# Patient Record
Sex: Female | Born: 1940 | Race: White | Hispanic: No | Marital: Single | Smoking: Former smoker
Health system: Southern US, Community
[De-identification: ages and names within clinical notes are randomized; demographics above are authoritative.]

## PROBLEM LIST (undated history)

## (undated) DIAGNOSIS — A4901 Methicillin susceptible Staphylococcus aureus infection, unspecified site: Secondary | ICD-10-CM

## (undated) DIAGNOSIS — L0390 Cellulitis, unspecified: Secondary | ICD-10-CM

## (undated) DIAGNOSIS — N63 Unspecified lump in unspecified breast: Secondary | ICD-10-CM

## (undated) DIAGNOSIS — C50919 Malignant neoplasm of unspecified site of unspecified female breast: Secondary | ICD-10-CM

## (undated) DIAGNOSIS — E119 Type 2 diabetes mellitus without complications: Secondary | ICD-10-CM

## (undated) DIAGNOSIS — Z973 Presence of spectacles and contact lenses: Secondary | ICD-10-CM

## (undated) DIAGNOSIS — I10 Essential (primary) hypertension: Secondary | ICD-10-CM

## (undated) DIAGNOSIS — Z923 Personal history of irradiation: Secondary | ICD-10-CM

## (undated) HISTORY — PX: BREAST LUMPECTOMY: SHX2

## (undated) HISTORY — PX: COLONOSCOPY: SHX174

## (undated) HISTORY — DX: Methicillin susceptible Staphylococcus aureus infection, unspecified site: A49.01

## (undated) HISTORY — DX: Type 2 diabetes mellitus without complications: E11.9

## (undated) HISTORY — DX: Essential (primary) hypertension: I10

## (undated) HISTORY — PX: TUBAL LIGATION: SHX77

## (undated) HISTORY — PX: TONSILLECTOMY: SUR1361

## (undated) HISTORY — PX: MOLE REMOVAL: SHX2046

## (undated) HISTORY — DX: Cellulitis, unspecified: L03.90

## (undated) HISTORY — PX: CHOLECYSTECTOMY: SHX55

## (undated) HISTORY — PX: CATARACT EXTRACTION, BILATERAL: SHX1313

## (undated) HISTORY — DX: Unspecified lump in unspecified breast: N63.0

---

## 1999-10-03 ENCOUNTER — Ambulatory Visit (HOSPITAL_COMMUNITY): Admission: RE | Admit: 1999-10-03 | Discharge: 1999-10-03 | Payer: Self-pay | Admitting: Family Medicine

## 1999-10-03 ENCOUNTER — Encounter: Payer: Self-pay | Admitting: Family Medicine

## 2001-05-14 ENCOUNTER — Encounter: Admission: RE | Admit: 2001-05-14 | Discharge: 2001-05-14 | Payer: Self-pay | Admitting: Family Medicine

## 2001-05-14 ENCOUNTER — Encounter: Payer: Self-pay | Admitting: Family Medicine

## 2001-11-24 ENCOUNTER — Ambulatory Visit (HOSPITAL_COMMUNITY): Admission: RE | Admit: 2001-11-24 | Discharge: 2001-11-24 | Payer: Self-pay | Admitting: *Deleted

## 2001-11-24 ENCOUNTER — Encounter: Payer: Self-pay | Admitting: *Deleted

## 2006-10-18 ENCOUNTER — Other Ambulatory Visit: Admission: RE | Admit: 2006-10-18 | Discharge: 2006-10-18 | Payer: Self-pay | Admitting: *Deleted

## 2006-10-21 ENCOUNTER — Encounter: Admission: RE | Admit: 2006-10-21 | Discharge: 2006-10-21 | Payer: Self-pay | Admitting: *Deleted

## 2006-10-29 ENCOUNTER — Encounter: Admission: RE | Admit: 2006-10-29 | Discharge: 2006-10-29 | Payer: Self-pay | Admitting: *Deleted

## 2008-08-06 HISTORY — PX: BACK SURGERY: SHX140

## 2009-03-24 ENCOUNTER — Other Ambulatory Visit: Admission: RE | Admit: 2009-03-24 | Discharge: 2009-03-24 | Payer: Self-pay | Admitting: Family Medicine

## 2009-03-28 ENCOUNTER — Encounter: Admission: RE | Admit: 2009-03-28 | Discharge: 2009-03-28 | Payer: Self-pay | Admitting: Family Medicine

## 2009-04-25 ENCOUNTER — Encounter: Admission: RE | Admit: 2009-04-25 | Discharge: 2009-04-25 | Payer: Self-pay | Admitting: Neurological Surgery

## 2009-05-12 ENCOUNTER — Inpatient Hospital Stay (HOSPITAL_COMMUNITY): Admission: RE | Admit: 2009-05-12 | Discharge: 2009-05-15 | Payer: Self-pay | Admitting: Neurological Surgery

## 2009-06-20 ENCOUNTER — Encounter: Admission: RE | Admit: 2009-06-20 | Discharge: 2009-06-20 | Payer: Self-pay | Admitting: Neurological Surgery

## 2009-08-29 ENCOUNTER — Encounter: Admission: RE | Admit: 2009-08-29 | Discharge: 2009-08-29 | Payer: Self-pay | Admitting: Neurological Surgery

## 2009-10-10 ENCOUNTER — Encounter: Admission: RE | Admit: 2009-10-10 | Discharge: 2009-10-10 | Payer: Self-pay | Admitting: Neurological Surgery

## 2010-01-09 ENCOUNTER — Encounter: Admission: RE | Admit: 2010-01-09 | Discharge: 2010-01-09 | Payer: Self-pay | Admitting: Neurological Surgery

## 2010-10-16 ENCOUNTER — Other Ambulatory Visit: Payer: Self-pay | Admitting: Family Medicine

## 2010-10-16 DIAGNOSIS — Z1231 Encounter for screening mammogram for malignant neoplasm of breast: Secondary | ICD-10-CM

## 2010-10-24 ENCOUNTER — Ambulatory Visit
Admission: RE | Admit: 2010-10-24 | Discharge: 2010-10-24 | Disposition: A | Discharge status: Home / Self Care | Payer: Medicare Other | Source: Ambulatory Visit | Attending: Family Medicine | Admitting: Family Medicine

## 2010-10-24 DIAGNOSIS — Z1231 Encounter for screening mammogram for malignant neoplasm of breast: Secondary | ICD-10-CM

## 2010-10-26 ENCOUNTER — Other Ambulatory Visit: Payer: Self-pay | Admitting: Family Medicine

## 2010-10-26 DIAGNOSIS — R928 Other abnormal and inconclusive findings on diagnostic imaging of breast: Secondary | ICD-10-CM

## 2010-11-02 ENCOUNTER — Other Ambulatory Visit: Payer: Self-pay | Admitting: Family Medicine

## 2010-11-02 ENCOUNTER — Ambulatory Visit
Admission: RE | Admit: 2010-11-02 | Discharge: 2010-11-02 | Disposition: A | Discharge status: Home / Self Care | Payer: 59 | Source: Ambulatory Visit | Attending: Family Medicine | Admitting: Family Medicine

## 2010-11-02 DIAGNOSIS — R928 Other abnormal and inconclusive findings on diagnostic imaging of breast: Secondary | ICD-10-CM

## 2010-11-02 DIAGNOSIS — N6320 Unspecified lump in the left breast, unspecified quadrant: Secondary | ICD-10-CM

## 2010-11-03 ENCOUNTER — Ambulatory Visit
Admission: RE | Admit: 2010-11-03 | Discharge: 2010-11-03 | Disposition: A | Discharge status: Home / Self Care | Payer: 59 | Source: Ambulatory Visit | Attending: Family Medicine | Admitting: Family Medicine

## 2010-11-03 ENCOUNTER — Other Ambulatory Visit: Payer: Self-pay | Admitting: Diagnostic Radiology

## 2010-11-03 ENCOUNTER — Other Ambulatory Visit: Payer: Self-pay | Admitting: Family Medicine

## 2010-11-03 DIAGNOSIS — R928 Other abnormal and inconclusive findings on diagnostic imaging of breast: Secondary | ICD-10-CM

## 2010-11-03 DIAGNOSIS — N6320 Unspecified lump in the left breast, unspecified quadrant: Secondary | ICD-10-CM

## 2010-11-07 ENCOUNTER — Other Ambulatory Visit: Payer: Self-pay | Admitting: Family Medicine

## 2010-11-07 DIAGNOSIS — C50912 Malignant neoplasm of unspecified site of left female breast: Secondary | ICD-10-CM

## 2010-11-09 LAB — BASIC METABOLIC PANEL
BUN: 29 mg/dL — ABNORMAL HIGH (ref 6–23)
Calcium: 10.1 mg/dL (ref 8.4–10.5)
Chloride: 108 mEq/L (ref 96–112)
Creatinine, Ser: 0.93 mg/dL (ref 0.4–1.2)
Glucose, Bld: 110 mg/dL — ABNORMAL HIGH (ref 70–99)
Potassium: 4.4 mEq/L (ref 3.5–5.1)
Sodium: 139 mEq/L (ref 135–145)

## 2010-11-09 LAB — GLUCOSE, CAPILLARY
Glucose-Capillary: 106 mg/dL — ABNORMAL HIGH (ref 70–99)
Glucose-Capillary: 119 mg/dL — ABNORMAL HIGH (ref 70–99)
Glucose-Capillary: 134 mg/dL — ABNORMAL HIGH (ref 70–99)

## 2010-11-09 LAB — CBC
HCT: 25.8 % — ABNORMAL LOW (ref 36.0–46.0)
HCT: 33.9 % — ABNORMAL LOW (ref 36.0–46.0)
Hemoglobin: 11.6 g/dL — ABNORMAL LOW (ref 12.0–15.0)
Hemoglobin: 8.5 g/dL — ABNORMAL LOW (ref 12.0–15.0)
MCHC: 33.4 g/dL (ref 30.0–36.0)
MCV: 93.8 fL (ref 78.0–100.0)
Platelets: 142 10*3/uL — ABNORMAL LOW (ref 150–400)
RBC: 2.7 MIL/uL — ABNORMAL LOW (ref 3.87–5.11)
RBC: 2.71 MIL/uL — ABNORMAL LOW (ref 3.87–5.11)
RDW: 14.2 % (ref 11.5–15.5)
WBC: 4.7 10*3/uL (ref 4.0–10.5)
WBC: 4.9 10*3/uL (ref 4.0–10.5)

## 2010-11-09 LAB — TYPE AND SCREEN: Antibody Screen: NEGATIVE

## 2010-11-09 LAB — DIFFERENTIAL
Basophils Absolute: 0.1 10*3/uL (ref 0.0–0.1)
Monocytes Relative: 9 % (ref 3–12)
Neutro Abs: 2.4 10*3/uL (ref 1.7–7.7)
Neutrophils Relative %: 51 % (ref 43–77)

## 2010-11-10 ENCOUNTER — Ambulatory Visit
Admission: RE | Admit: 2010-11-10 | Discharge: 2010-11-10 | Disposition: A | Discharge status: Home / Self Care | Payer: 59 | Source: Ambulatory Visit | Attending: Family Medicine | Admitting: Family Medicine

## 2010-11-10 DIAGNOSIS — C50912 Malignant neoplasm of unspecified site of left female breast: Secondary | ICD-10-CM

## 2010-11-10 MED ORDER — GADOBENATE DIMEGLUMINE 529 MG/ML IV SOLN
15.0000 mL | Freq: Once | INTRAVENOUS | Status: AC | PRN
  Administered 2010-11-10: 15 mL via INTRAVENOUS

## 2010-11-15 ENCOUNTER — Encounter (HOSPITAL_BASED_OUTPATIENT_CLINIC_OR_DEPARTMENT_OTHER): Payer: 59 | Admitting: Oncology

## 2010-11-15 ENCOUNTER — Other Ambulatory Visit: Payer: Self-pay | Admitting: Oncology

## 2010-11-15 DIAGNOSIS — Z09 Encounter for follow-up examination after completed treatment for conditions other than malignant neoplasm: Secondary | ICD-10-CM

## 2010-11-15 DIAGNOSIS — C50419 Malignant neoplasm of upper-outer quadrant of unspecified female breast: Secondary | ICD-10-CM

## 2010-11-15 DIAGNOSIS — C50919 Malignant neoplasm of unspecified site of unspecified female breast: Secondary | ICD-10-CM

## 2010-11-15 LAB — CBC WITH DIFFERENTIAL/PLATELET
BASO%: 0.7 % (ref 0.0–2.0)
Eosinophils Absolute: 0.2 10*3/uL (ref 0.0–0.5)
HCT: 33 % — ABNORMAL LOW (ref 34.8–46.6)
MCHC: 34.5 g/dL (ref 31.5–36.0)
MONO#: 0.5 10*3/uL (ref 0.1–0.9)
NEUT#: 2.2 10*3/uL (ref 1.5–6.5)
RBC: 3.6 10*6/uL — ABNORMAL LOW (ref 3.70–5.45)
WBC: 4.6 10*3/uL (ref 3.9–10.3)
lymph#: 1.7 10*3/uL (ref 0.9–3.3)

## 2010-11-15 LAB — COMPREHENSIVE METABOLIC PANEL
ALT: 18 U/L (ref 0–35)
Albumin: 4.1 g/dL (ref 3.5–5.2)
CO2: 25 mEq/L (ref 19–32)
Calcium: 9.3 mg/dL (ref 8.4–10.5)
Chloride: 106 mEq/L (ref 96–112)
Sodium: 138 mEq/L (ref 135–145)
Total Protein: 6.9 g/dL (ref 6.0–8.3)

## 2010-11-15 LAB — CANCER ANTIGEN 27.29: CA 27.29: 19 U/mL (ref 0–39)

## 2010-11-17 ENCOUNTER — Other Ambulatory Visit: Payer: Self-pay | Admitting: General Surgery

## 2010-11-17 ENCOUNTER — Other Ambulatory Visit (HOSPITAL_COMMUNITY): Payer: Self-pay | Admitting: General Surgery

## 2010-11-17 DIAGNOSIS — C50912 Malignant neoplasm of unspecified site of left female breast: Secondary | ICD-10-CM

## 2010-11-20 ENCOUNTER — Ambulatory Visit
Admission: RE | Admit: 2010-11-20 | Discharge: 2010-11-20 | Disposition: A | Discharge status: Home / Self Care | Payer: 59 | Source: Ambulatory Visit | Attending: Oncology | Admitting: Oncology

## 2010-11-20 DIAGNOSIS — Z09 Encounter for follow-up examination after completed treatment for conditions other than malignant neoplasm: Secondary | ICD-10-CM

## 2010-11-24 ENCOUNTER — Encounter (HOSPITAL_COMMUNITY): Payer: Self-pay

## 2010-11-24 ENCOUNTER — Ambulatory Visit (HOSPITAL_COMMUNITY)
Admission: RE | Admit: 2010-11-24 | Discharge: 2010-11-24 | Disposition: A | Discharge status: Home / Self Care | Payer: 59 | Source: Ambulatory Visit | Attending: General Surgery | Admitting: General Surgery

## 2010-11-24 ENCOUNTER — Other Ambulatory Visit (HOSPITAL_COMMUNITY): Payer: Self-pay | Admitting: General Surgery

## 2010-11-24 ENCOUNTER — Encounter (HOSPITAL_COMMUNITY)
Admission: RE | Admit: 2010-11-24 | Discharge: 2010-11-24 | Disposition: A | Discharge status: Home / Self Care | Payer: 59 | Source: Ambulatory Visit | Attending: Oncology | Admitting: Oncology

## 2010-11-24 ENCOUNTER — Encounter (HOSPITAL_COMMUNITY)
Admission: RE | Admit: 2010-11-24 | Discharge: 2010-11-24 | Disposition: A | Discharge status: Home / Self Care | Payer: 59 | Source: Ambulatory Visit | Attending: General Surgery | Admitting: General Surgery

## 2010-11-24 DIAGNOSIS — J984 Other disorders of lung: Secondary | ICD-10-CM | POA: Insufficient documentation

## 2010-11-24 DIAGNOSIS — Z981 Arthrodesis status: Secondary | ICD-10-CM | POA: Insufficient documentation

## 2010-11-24 DIAGNOSIS — C50919 Malignant neoplasm of unspecified site of unspecified female breast: Secondary | ICD-10-CM | POA: Insufficient documentation

## 2010-11-24 DIAGNOSIS — Z01811 Encounter for preprocedural respiratory examination: Secondary | ICD-10-CM

## 2010-11-24 DIAGNOSIS — Z01810 Encounter for preprocedural cardiovascular examination: Secondary | ICD-10-CM | POA: Insufficient documentation

## 2010-11-24 DIAGNOSIS — Z01812 Encounter for preprocedural laboratory examination: Secondary | ICD-10-CM | POA: Insufficient documentation

## 2010-11-24 DIAGNOSIS — Z01818 Encounter for other preprocedural examination: Secondary | ICD-10-CM | POA: Insufficient documentation

## 2010-11-24 HISTORY — DX: Malignant neoplasm of unspecified site of unspecified female breast: C50.919

## 2010-11-24 HISTORY — DX: Essential (primary) hypertension: I10

## 2010-11-24 LAB — CBC
MCHC: 33.9 g/dL (ref 30.0–36.0)
Platelets: 223 10*3/uL (ref 150–400)
RDW: 13.9 % (ref 11.5–15.5)

## 2010-11-24 LAB — BASIC METABOLIC PANEL
Calcium: 9.6 mg/dL (ref 8.4–10.5)
GFR calc Af Amer: >60 mL/min (ref >60)
GFR calc non Af Amer: >60 mL/min (ref >60)
Glucose, Bld: 91 mg/dL (ref 70–99)
Sodium: 138 mEq/L (ref 135–145)

## 2010-11-24 LAB — SURGICAL PCR SCREEN: MRSA, PCR: NEGATIVE

## 2010-11-24 MED ORDER — TECHNETIUM TC 99M MEDRONATE IV KIT
24.0000 | PACK | Freq: Once | INTRAVENOUS | Status: AC | PRN
  Administered 2010-11-24: 24 via INTRAVENOUS

## 2010-11-24 MED ORDER — IOHEXOL 300 MG/ML  SOLN
80.0000 mL | Freq: Once | INTRAMUSCULAR | Status: AC | PRN
  Administered 2010-11-24: 80 mL via INTRAVENOUS

## 2010-11-28 ENCOUNTER — Ambulatory Visit (HOSPITAL_COMMUNITY)
Admission: RE | Admit: 2010-11-28 | Discharge: 2010-11-28 | Disposition: A | Discharge status: Home / Self Care | Payer: 59 | Source: Ambulatory Visit | Attending: General Surgery | Admitting: General Surgery

## 2010-11-28 ENCOUNTER — Other Ambulatory Visit: Payer: Self-pay | Admitting: General Surgery

## 2010-11-28 ENCOUNTER — Ambulatory Visit
Admission: RE | Admit: 2010-11-28 | Discharge: 2010-11-28 | Disposition: A | Discharge status: Home / Self Care | Payer: 59 | Source: Ambulatory Visit | Attending: General Surgery | Admitting: General Surgery

## 2010-11-28 DIAGNOSIS — C50912 Malignant neoplasm of unspecified site of left female breast: Secondary | ICD-10-CM

## 2010-11-28 DIAGNOSIS — C50919 Malignant neoplasm of unspecified site of unspecified female breast: Secondary | ICD-10-CM | POA: Insufficient documentation

## 2010-11-28 HISTORY — PX: BREAST SURGERY: SHX581

## 2010-11-28 LAB — GLUCOSE, CAPILLARY
Glucose-Capillary: 136 mg/dL — ABNORMAL HIGH (ref 70–99)
Glucose-Capillary: 121 mg/dL — ABNORMAL HIGH (ref 70–99)

## 2010-11-28 MED ORDER — TECHNETIUM TC 99M SULFUR COLLOID FILTERED
1.0000 | Freq: Once | INTRAVENOUS | Status: AC | PRN
  Administered 2010-11-28: 1 via INTRADERMAL

## 2010-12-03 NOTE — Op Note (Signed)
Renee [name], Renee [name]              ACCOUNT NO.:  [id number]  MEDICAL RECORD NO.:  [id number]           PATIENT TYPE:  O  LOCATION:  [facility]                         FACILITY:  [facility]  PHYSICIAN:  Ollen Gross. Vernell Morgans, M.D. DATE OF BIRTH:  Mar 27, 1941  DATE OF PROCEDURE:  11/28/2010 DATE OF DISCHARGE:  11/28/2010                              OPERATIVE REPORT   PREOPERATIVE DIAGNOSIS:  Left breast cancer.  POSTOPERATIVE DIAGNOSIS:  Left breast cancer.  PROCEDURES:  Left breast needle-localized lumpectomy and sentinel node biopsy x2 with injection of blue dye.  SURGEON:  Ollen Gross. Vernell Morgans, MD  ANESTHESIA:  General via LMA.  PROCEDURE IN DETAILS:  After informed consent was obtained, the patient was brought to the operating room, placed in supine position on the operating room table.  After an induction of general anesthesia, the patient's left chest, breast and axillary area were prepped with ChloraPrep, allowed to dry and then draped in usual sterile manner. Earlier in the day the patient had undergone an injection of 1 mCi of technetium sulfur colloid in the subareolar position, also earlier in the day the patient underwent a wire localization procedure and the wire was entering the upper outer portion of the left breast and headed medially.  At this point a curvilinear incision was made overlying the path of the wire.  The tumor was so high up in the upper outer quadrant that we actually were able to do the sentinel node biopsy through the same incision.  The hot spot in the left axilla was mapped out and a mark was placed on the skin overlying the hot spot.  The incision was made with a 15 blade knife and carried down through the skin and subcutaneous tissue sharply with the electrocautery.  Once into the breast tissue, a circular portion of breast tissue was excised sharply around the path of the wire.  This was all done with the electrocautery. At the same time the NeoProbe was used  to identify the hot spot up in the left axilla using a combination of some sharp dissection with the electrocautery and blunt tonsil dissection, we were able to identify initially a hot blue lymph node.  It was excised by some sharp dissection with the cautery and then the lymphatics were clamped with hemostats, divided and ligated with 3-0 Vicryl ties.  Ex vivo counts on sentinel node #1 were around 1300.  A second hot spot was identified. This was also excised sharply with the electrocautery.  Ex vivo counts on this were around 300.  There was no blue dye in this area.  This was sent as a sentinel node #2.  No other hot blue or palpable lymph nodes were identified in the left axilla.  The lumpectomy was then completed. The specimen radiograph was obtained that showed the clip and wire to be in the specimen.  It looked as though we were a little bit close on the inferior portion of the specimen and that is certainly where we seemed to be coming across some more nodular material.  An extra bit of the inferior portion of the cavity was  excised sharply with the electrocautery and stitches were placed on the new true surgical margin. The short stitch being medial and the long stitch being lateral.  The specimen itself was painted with the assigned paint colors.  I did put the yellow color medial and the orange color lateral which is transposed from the assigned paint colors.  Once this was accomplished, hemostasis was achieved using the Bovie electrocautery.  Wound was irrigated with copious amounts of saline.  The wound was infiltrated with 0.25% Marcaine.  The deep layer of the wound was then closed with interrupted 3-0 Vicryl stitches and the skin was closed with a running 4-0 Monocryl subcuticular stitch. Dermabond dressings were applied.  The patient tolerated the procedure well.  At the end of case, all needle, sponge and instrument counts were correct.  The patient was then awakened  and taken to recovery room in stable condition.     Ollen Gross. Vernell Morgans, M.D.     PST/MEDQ  D:  11/28/2010  T:  11/29/2010  Job:  [id number]  Electronically Signed by Chevis Pretty III M.D. on 12/03/2010 06:41:10 AM

## 2010-12-08 ENCOUNTER — Encounter (HOSPITAL_BASED_OUTPATIENT_CLINIC_OR_DEPARTMENT_OTHER)
Admission: RE | Admit: 2010-12-08 | Discharge: 2010-12-08 | Disposition: A | Discharge status: Home / Self Care | Payer: 59 | Source: Ambulatory Visit | Attending: General Surgery | Admitting: General Surgery

## 2010-12-08 LAB — BASIC METABOLIC PANEL
Calcium: 9.9 mg/dL (ref 8.4–10.5)
GFR calc Af Amer: >60 mL/min (ref >60)
GFR calc non Af Amer: 56 mL/min — ABNORMAL LOW (ref >60)
Potassium: 4.8 mEq/L (ref 3.5–5.1)
Sodium: 137 mEq/L (ref 135–145)

## 2010-12-11 ENCOUNTER — Other Ambulatory Visit: Payer: Self-pay | Admitting: General Surgery

## 2010-12-11 ENCOUNTER — Ambulatory Visit (HOSPITAL_BASED_OUTPATIENT_CLINIC_OR_DEPARTMENT_OTHER)
Admission: RE | Admit: 2010-12-11 | Discharge: 2010-12-11 | Disposition: A | Discharge status: Home / Self Care | Payer: 59 | Source: Ambulatory Visit | Attending: General Surgery | Admitting: General Surgery

## 2010-12-11 DIAGNOSIS — C50919 Malignant neoplasm of unspecified site of unspecified female breast: Secondary | ICD-10-CM | POA: Insufficient documentation

## 2010-12-11 DIAGNOSIS — Z79899 Other long term (current) drug therapy: Secondary | ICD-10-CM | POA: Insufficient documentation

## 2010-12-11 DIAGNOSIS — Z01812 Encounter for preprocedural laboratory examination: Secondary | ICD-10-CM | POA: Insufficient documentation

## 2010-12-11 LAB — GLUCOSE, CAPILLARY: Glucose-Capillary: 128 mg/dL — ABNORMAL HIGH (ref 70–99)

## 2010-12-14 ENCOUNTER — Encounter (HOSPITAL_BASED_OUTPATIENT_CLINIC_OR_DEPARTMENT_OTHER): Payer: 59 | Admitting: Oncology

## 2010-12-14 DIAGNOSIS — C50419 Malignant neoplasm of upper-outer quadrant of unspecified female breast: Secondary | ICD-10-CM

## 2010-12-19 NOTE — Op Note (Signed)
  Renee [name], SCHMUTZ              ACCOUNT NO.:  [id number]  MEDICAL RECORD NO.:  [id number]           PATIENT TYPE:  LOCATION:                                 FACILITY:  PHYSICIAN:  Ollen Gross. Vernell Morgans, M.D. DATE OF BIRTH:  03/31/1941  DATE OF PROCEDURE:  12/11/2010 DATE OF DISCHARGE:                              OPERATIVE REPORT   PREOPERATIVE DIAGNOSIS:  Left breast cancer with positive deep margin.  POSTOPERATIVE DIAGNOSIS:  Left breast cancer with positive deep margin.  PROCEDURE:  Re-excision of left breast deep margin.  SURGEON:  Ollen Gross. Vernell Morgans, MD  ANESTHESIA:  General via LMA.  PROCEDURE:  After informed consent was obtained, the patient was brought to the operating room and placed in supine position on the operating room table.  After adequate induction of general anesthesia, the patient's left breast was prepped with ChloraPrep and allowed to dry and draped in usual sterile manner.  The old incision was opened sharply with a 15 blade knife.  The seroma was evacuated.  The cavity was inspected.  The deep portion of the cavity was excised sharply with electrocautery all the way down to the chest wall muscle.  This specimen once it was removed was oriented with a short stitch superior and a long stitch on the new true deep surgical margin that was sent to pathology for further evaluation.  Hemostasis was achieved using Bovie electrocautery.  The wound was irrigated with copious amounts of saline and infiltrated with 0.25% Marcaine.  The deep layer of the wound was then closed with interrupted 3-0 Vicryl stitches and the skin was closed with a running 4-0 Monocryl subcuticular stitch.  Dermabond dressing was applied.  The patient tolerated the procedure well.  At the end of the case, all needle, sponge and instrument counts were correct.  The patient was then awakened and taken to recovery room in stable condition.     Ollen Gross. Vernell Morgans, M.D.     PST/MEDQ  D:   12/11/2010  T:  12/11/2010  Job:  [id number]  Electronically Signed by Chevis Pretty III M.D. on 12/19/2010 09:35:21 AM

## 2010-12-20 ENCOUNTER — Ambulatory Visit
Admission: RE | Admit: 2010-12-20 | Discharge: 2010-12-20 | Disposition: A | Discharge status: Home / Self Care | Payer: 59 | Source: Ambulatory Visit | Attending: Radiation Oncology | Admitting: Radiation Oncology

## 2010-12-20 DIAGNOSIS — Z9089 Acquired absence of other organs: Secondary | ICD-10-CM | POA: Insufficient documentation

## 2010-12-20 DIAGNOSIS — I10 Essential (primary) hypertension: Secondary | ICD-10-CM | POA: Insufficient documentation

## 2010-12-20 DIAGNOSIS — Z79899 Other long term (current) drug therapy: Secondary | ICD-10-CM | POA: Insufficient documentation

## 2010-12-20 DIAGNOSIS — Z170 Estrogen receptor positive status [ER+]: Secondary | ICD-10-CM | POA: Insufficient documentation

## 2010-12-20 DIAGNOSIS — C50419 Malignant neoplasm of upper-outer quadrant of unspecified female breast: Secondary | ICD-10-CM | POA: Insufficient documentation

## 2010-12-20 DIAGNOSIS — E785 Hyperlipidemia, unspecified: Secondary | ICD-10-CM | POA: Insufficient documentation

## 2010-12-20 DIAGNOSIS — E119 Type 2 diabetes mellitus without complications: Secondary | ICD-10-CM | POA: Insufficient documentation

## 2010-12-20 DIAGNOSIS — M129 Arthropathy, unspecified: Secondary | ICD-10-CM | POA: Insufficient documentation

## 2011-02-19 ENCOUNTER — Ambulatory Visit (INDEPENDENT_AMBULATORY_CARE_PROVIDER_SITE_OTHER): Payer: Medicare Other | Admitting: General Surgery

## 2011-02-19 ENCOUNTER — Encounter (INDEPENDENT_AMBULATORY_CARE_PROVIDER_SITE_OTHER): Payer: Self-pay | Admitting: General Surgery

## 2011-02-19 DIAGNOSIS — C50212 Malignant neoplasm of upper-inner quadrant of left female breast: Secondary | ICD-10-CM | POA: Insufficient documentation

## 2011-02-19 DIAGNOSIS — C50919 Malignant neoplasm of unspecified site of unspecified female breast: Secondary | ICD-10-CM

## 2011-02-19 NOTE — Progress Notes (Signed)
Subjective:     Patient ID: Renee [name], female   DOB: 15-Apr-1941, 70 y.o.   MRN: [id number]  HPI The patient is a 70 year old white female who is now about 3 months out from a left breast lumpectomy and negative sentinel node biopsy for a lobular T2 N0 left breast cancer. She was ER positive PR negative. She underwent appetite testing and her recurrence score was low. She finished her radiation therapy this morning. Except for some radiation burn to her skin she has no complaints.  Review of Systems     Objective:   Physical Exam On exam her left breast incision has healed up nicely. She has some significant radiation burn to the skin. Her breast is otherwise soft. She has a little thickness disease scar from the radiation change.    Assessment:     3 months status post lumpectomy for a lobular left breast cancer    Plan:     She will continue use the cream to help her skin heal. She has a prescription to start her antiestrogen therapy. We will plan to see her back in 3 months.

## 2011-03-02 ENCOUNTER — Ambulatory Visit (INDEPENDENT_AMBULATORY_CARE_PROVIDER_SITE_OTHER): Payer: Medicare Other | Admitting: General Surgery

## 2011-03-02 ENCOUNTER — Encounter (INDEPENDENT_AMBULATORY_CARE_PROVIDER_SITE_OTHER): Payer: Self-pay | Admitting: General Surgery

## 2011-03-02 ENCOUNTER — Other Ambulatory Visit (INDEPENDENT_AMBULATORY_CARE_PROVIDER_SITE_OTHER): Payer: Self-pay | Admitting: General Surgery

## 2011-03-02 DIAGNOSIS — IMO0002 Reserved for concepts with insufficient information to code with codable children: Secondary | ICD-10-CM

## 2011-03-02 MED ORDER — DOXYCYCLINE HYCLATE 50 MG PO CAPS: 100.0000 mg | ORAL_CAPSULE | Freq: Two times a day (BID) | ORAL | Status: DC

## 2011-03-02 NOTE — Progress Notes (Signed)
HPI This patient of Dr. Royston Sinner comes in with recurrent tenderness in her left breast. She is status post lumpectomy and had a previous aspiration of this fluid area by Dr. Jamey Ripa in May 2012.  PE On examination today she has a warm written breast not severely indurated very tender. There appeared to be in the left upper quadrant some fluid in the subcutaneous tissue.  Studiy review There are no studies to review.  Assessment Incisional seroma at previous lumpectomy site and patient who's had chemoradiation therapy.  Plan Under local anesthesia with Betadine prep of the aspirate 30 cc of cloudy serous fluid from her left breast. This brought some immediate relief to the patient who was somewhat apprehensive.  I will place her on doxycycline for the next 2 weeks and have her reassess the Dr. Carolynne Edouard in 2 weeks.

## 2011-03-08 ENCOUNTER — Other Ambulatory Visit (INDEPENDENT_AMBULATORY_CARE_PROVIDER_SITE_OTHER): Payer: Self-pay | Admitting: General Surgery

## 2011-03-08 ENCOUNTER — Encounter (INDEPENDENT_AMBULATORY_CARE_PROVIDER_SITE_OTHER): Payer: Self-pay | Admitting: General Surgery

## 2011-03-08 ENCOUNTER — Ambulatory Visit (INDEPENDENT_AMBULATORY_CARE_PROVIDER_SITE_OTHER): Payer: Medicare Other | Admitting: General Surgery

## 2011-03-08 VITALS — BP 156/92 | HR 80 | Temp 97.2°F

## 2011-03-08 DIAGNOSIS — IMO0002 Reserved for concepts with insufficient information to code with codable children: Secondary | ICD-10-CM

## 2011-03-08 MED ORDER — DOXYCYCLINE HYCLATE 100 MG PO TABS: 100.0000 mg | ORAL_TABLET | Freq: Two times a day (BID) | ORAL | Status: DC

## 2011-03-08 NOTE — Patient Instructions (Signed)
Continue doxycycline F/U in 1 week

## 2011-03-09 LAB — WOUND CULTURE

## 2011-03-09 NOTE — Progress Notes (Signed)
Subjective:     Patient ID: Retia Passe, female   DOB: 29-Nov-1940, 70 y.o.   MRN: [id number]  HPI The patient is a 70 year old white female who is now about 3/2 months out from a left lumpectomy and sentinel node mapping for a T2 N0 left breast cancer. She finished radiation the couple weeks ago. She had a low oncotype score and is not going to receive any chemotherapy. Over the last few days she's developed redness and pain in the left breast. Review of Systems     Objective:   Physical Exam On exam the left breast is red and hot. She is very tender over the lumpectomy site. We prepped her skin with alcohol and Betadine infiltrated with 1% lidocaine and drained about 15-20 cc of cloudy fluid out. This was sent for culture and sensitivity.    Assessment:     Cellulitis and possible abscess of the lumpectomy cavity.    Plan:     At this point I would favor using the doxycycline over the Augmentin in case it is a resistant bug. We will wait for the culture results to come back. I'll plan to see her next week to check her progress. She agrees to call me if she has any worsening of her symptoms in the meantime.

## 2011-03-12 LAB — CULTURE, ROUTINE-ABSCESS
Gram Stain: NONE SEEN
Gram Stain: NONE SEEN

## 2011-03-15 ENCOUNTER — Other Ambulatory Visit (INDEPENDENT_AMBULATORY_CARE_PROVIDER_SITE_OTHER): Payer: Self-pay

## 2011-03-15 DIAGNOSIS — L0390 Cellulitis, unspecified: Secondary | ICD-10-CM

## 2011-03-19 ENCOUNTER — Ambulatory Visit (INDEPENDENT_AMBULATORY_CARE_PROVIDER_SITE_OTHER): Payer: 59 | Admitting: General Surgery

## 2011-03-19 ENCOUNTER — Encounter (INDEPENDENT_AMBULATORY_CARE_PROVIDER_SITE_OTHER): Payer: Self-pay | Admitting: General Surgery

## 2011-03-19 DIAGNOSIS — C50919 Malignant neoplasm of unspecified site of unspecified female breast: Secondary | ICD-10-CM

## 2011-03-19 NOTE — Patient Instructions (Signed)
Plan for ultrasound guided aspiration of left breast F/U in next 1-2 weeks

## 2011-03-20 ENCOUNTER — Other Ambulatory Visit (INDEPENDENT_AMBULATORY_CARE_PROVIDER_SITE_OTHER): Payer: Self-pay | Admitting: General Surgery

## 2011-03-20 ENCOUNTER — Encounter (INDEPENDENT_AMBULATORY_CARE_PROVIDER_SITE_OTHER): Payer: Self-pay | Admitting: General Surgery

## 2011-03-20 DIAGNOSIS — C50919 Malignant neoplasm of unspecified site of unspecified female breast: Secondary | ICD-10-CM

## 2011-03-20 NOTE — Progress Notes (Signed)
Subjective:     Patient ID: Renee [name], female   DOB: July 28, 1941, 70 y.o.   MRN: [id number]  HPI The patient is a 70 yo wf who is almost 4 mo out from a left lumpectomy and neg sln bx for a T2N0 lobular left breast cancer. She recently finished radiation therapy which was complicated by an increase in her seroma and significant cellulitis of left breast. She was tx with clindamycin and redness has almost completely resolved. We aspirated the seroma last time but only got about 20-30 cc out. Review of Systems     Objective:   Physical Exam Left breast incision is healing well. Redness has almost completely resolved but still has large tense feeling seroma    Assessment:     Postop seroma increased after radiation tx    Plan:     Finish clindamycin. Will have Breast center aspirate seroma more completely under u/s guidance. F/U in 1-2 weeks

## 2011-03-26 ENCOUNTER — Ambulatory Visit
Admission: RE | Admit: 2011-03-26 | Discharge: 2011-03-26 | Disposition: A | Discharge status: Home / Self Care | Payer: 59 | Source: Ambulatory Visit | Attending: General Surgery | Admitting: General Surgery

## 2011-03-26 ENCOUNTER — Other Ambulatory Visit (INDEPENDENT_AMBULATORY_CARE_PROVIDER_SITE_OTHER): Payer: Self-pay | Admitting: General Surgery

## 2011-03-26 ENCOUNTER — Encounter (INDEPENDENT_AMBULATORY_CARE_PROVIDER_SITE_OTHER): Payer: Self-pay | Admitting: General Surgery

## 2011-03-26 ENCOUNTER — Ambulatory Visit: Admission: RE | Admit: 2011-03-26 | Payer: 59 | Source: Ambulatory Visit

## 2011-03-26 DIAGNOSIS — C50919 Malignant neoplasm of unspecified site of unspecified female breast: Secondary | ICD-10-CM

## 2011-03-27 ENCOUNTER — Encounter (INDEPENDENT_AMBULATORY_CARE_PROVIDER_SITE_OTHER): Payer: Self-pay | Admitting: General Surgery

## 2011-03-27 ENCOUNTER — Ambulatory Visit (INDEPENDENT_AMBULATORY_CARE_PROVIDER_SITE_OTHER): Payer: 59 | Admitting: General Surgery

## 2011-03-27 DIAGNOSIS — C50919 Malignant neoplasm of unspecified site of unspecified female breast: Secondary | ICD-10-CM

## 2011-03-27 NOTE — Progress Notes (Signed)
Subjective:     Patient ID: Renee [name], female   DOB: 03/05/1941, 70 y.o.   MRN: [id number]  HPI The patient is a 70 year old white female who is now 4 months out from a left breast lumpectomy and negative sentinel node biopsy for a T2 N0 left breast cancer. She received radiation therapy. Her course has been complicated by some cellulitis and and increasing seroma in the left breast. She was treated with antibiotics and aspiration of the fluid and has significantly improved. She is having less swelling of the area and less discomfort. The cellulitis has completely resolved.  Review of Systems     Objective:   Physical Exam On exam Lungs: Clear bilaterally with no use of accessory respiratory muscles Heart: Regular rate and rhythm with an impulse in the left chest Abdomen: Soft and nontender with no palpable mass or hepatosplenomegaly Breast: The palpable fullness in the lateral aspect of the left breast has diminished. No other palpable mass in either breast. No axillary supraclavicular or cervical lymphadenopathy in either side.   Assessment:     4 months out from a left breast lumpectomy and negative sentinel node biopsy    Plan:     At this point she is planning to start her antiestrogen therapy. She will continue with regular self exams. We will plan to reexamine her in about 3 months to check her progress.

## 2011-03-27 NOTE — Patient Instructions (Signed)
Continue regular self exams Start antiestrogen therapy

## 2011-05-18 ENCOUNTER — Other Ambulatory Visit: Payer: Self-pay | Admitting: Oncology

## 2011-05-18 ENCOUNTER — Encounter (HOSPITAL_BASED_OUTPATIENT_CLINIC_OR_DEPARTMENT_OTHER): Payer: 59 | Admitting: Oncology

## 2011-05-18 DIAGNOSIS — C50419 Malignant neoplasm of upper-outer quadrant of unspecified female breast: Secondary | ICD-10-CM

## 2011-05-18 DIAGNOSIS — D649 Anemia, unspecified: Secondary | ICD-10-CM

## 2011-05-18 LAB — CBC WITH DIFFERENTIAL/PLATELET
BASO%: 0.6 % (ref 0.0–2.0)
Eosinophils Absolute: 0.1 10*3/uL (ref 0.0–0.5)
LYMPH%: 22.3 % (ref 14.0–49.7)
MCHC: 33.9 g/dL (ref 31.5–36.0)
MCV: 93.2 fL (ref 79.5–101.0)
MONO%: 9.1 % (ref 0.0–14.0)
NEUT#: 2.8 10*3/uL (ref 1.5–6.5)
Platelets: 241 10*3/uL (ref 145–400)
RBC: 3.21 10*6/uL — ABNORMAL LOW (ref 3.70–5.45)
RDW: 15 % — ABNORMAL HIGH (ref 11.2–14.5)
WBC: 4.3 10*3/uL (ref 3.9–10.3)

## 2011-05-18 LAB — COMPREHENSIVE METABOLIC PANEL
ALT: 9 U/L (ref 0–35)
AST: 17 U/L (ref 0–37)
Albumin: 4.2 g/dL (ref 3.5–5.2)
Alkaline Phosphatase: 63 U/L (ref 39–117)
Glucose, Bld: 131 mg/dL — ABNORMAL HIGH (ref 70–99)
Potassium: 4.2 mEq/L (ref 3.5–5.3)
Sodium: 140 mEq/L (ref 135–145)
Total Bilirubin: 0.4 mg/dL (ref 0.3–1.2)
Total Protein: 7 g/dL (ref 6.0–8.3)

## 2011-05-19 LAB — VITAMIN B12: Vitamin B-12: 389 pg/mL (ref 211–911)

## 2011-05-19 LAB — FERRITIN: Ferritin: 75 ng/mL (ref 10–291)

## 2011-05-19 LAB — IRON AND TIBC
%SAT: 21 % (ref 20–55)
Iron: 67 ug/dL (ref 42–145)

## 2011-06-05 ENCOUNTER — Encounter (INDEPENDENT_AMBULATORY_CARE_PROVIDER_SITE_OTHER): Payer: Self-pay | Admitting: General Surgery

## 2011-06-05 ENCOUNTER — Ambulatory Visit (INDEPENDENT_AMBULATORY_CARE_PROVIDER_SITE_OTHER): Payer: 59 | Admitting: General Surgery

## 2011-06-05 DIAGNOSIS — C50919 Malignant neoplasm of unspecified site of unspecified female breast: Secondary | ICD-10-CM

## 2011-06-05 DIAGNOSIS — IMO0002 Reserved for concepts with insufficient information to code with codable children: Secondary | ICD-10-CM

## 2011-06-05 NOTE — Progress Notes (Signed)
Subjective:     Patient ID: Renee [name], female   DOB: [birth year], 70 y.o.   MRN: [id number]  HPI Impression is a 70 year old white female who is now about 6 months out from a left breast lumpectomy and negative sentinel node biopsy for a T2 N0 lobular left breast cancer. Her postoperative course has been complicated by a persistent seroma. She complains of a lot of soreness associated with the seroma today. She's had no discharge from her nipple on either side.  Review of Systems  Constitutional: Negative.   HENT: Negative.   Eyes: Negative.   Respiratory: Negative.   Cardiovascular: Negative.   Gastrointestinal: Negative.   Genitourinary: Negative.   Musculoskeletal: Negative.   Skin: Negative.   Neurological: Negative.   Hematological: Negative.   Psychiatric/Behavioral: Negative.        Objective:   Physical Exam  Constitutional: She is oriented to person, place, and time. She appears well-developed and well-nourished.  HENT:  Head: Normocephalic and atraumatic.  Eyes: Conjunctivae and EOM are normal. Pupils are equal, round, and reactive to light.  Neck: Normal range of motion. Neck supple.  Cardiovascular: Normal rate, regular rhythm and normal heart sounds.   Pulmonary/Chest: Effort normal and breath sounds normal.       The patient seems to have a large tense seroma in the upper outer aspect of the left breast. No other palpable mass in either breast. No axillary supraclavicular or cervical lymphadenopathy. No redness of the left breast.  Abdominal: Soft. Bowel sounds are normal.  Musculoskeletal: Normal range of motion.  Neurological: She is alert and oriented to person, place, and time.  Skin: Skin is warm and dry.  Psychiatric: She has a normal mood and affect. Her behavior is normal.       Assessment:     6 months status post left breast lumpectomy and negative sentinel node biopsy with a persistent seroma    Plan:     We will plan to bring her back in the  next couple weeks for an ultrasound and possible aspiration if we can find a pocket of fluid

## 2011-06-05 NOTE — Patient Instructions (Signed)
Schedule 30 min appt  In about 2 weeks for u/s and possible aspiration of seroma

## 2011-06-26 ENCOUNTER — Encounter (INDEPENDENT_AMBULATORY_CARE_PROVIDER_SITE_OTHER): Payer: Self-pay | Admitting: General Surgery

## 2011-06-26 ENCOUNTER — Ambulatory Visit (INDEPENDENT_AMBULATORY_CARE_PROVIDER_SITE_OTHER): Payer: 59 | Admitting: General Surgery

## 2011-06-26 DIAGNOSIS — IMO0002 Reserved for concepts with insufficient information to code with codable children: Secondary | ICD-10-CM

## 2011-06-26 DIAGNOSIS — C50919 Malignant neoplasm of unspecified site of unspecified female breast: Secondary | ICD-10-CM

## 2011-07-02 NOTE — Progress Notes (Signed)
Subjective:     Patient ID: Renee [name], female   DOB: 08-12-1940, 70 y.o.   MRN: [id number]  HPI The patient is a 70 year old white female who is now a little over 6 months out from a left breast lumpectomy and negative sentinel node biopsy. Her postoperative course was complicated by a seroma at the operative site. This area has been fairly tight and tense for her. We have tried to aspirate it with minimal return of fluid in the past. Since her last visit she has a little less discomfort associated with it.  Review of Systems     Objective:   Physical Exam On exam she still has a palpable tense seroma in the upper outer quadrant of the left breast. We ultrasounded this area today and it does not look like simple fluid. There are a lot of septations in the center of the fluid which is probably why we were unable to aspirate it in the past    Assessment:     6 months status post left breast lumpectomy and negative sentinel node biopsy    Plan:     Ultrasound showed the fluid in the seroma to be loculated. We therefore did not try to aspirate the fluid today. She is feeling better anyway. We will plan to see her back in another 3 months or so.

## 2011-08-03 ENCOUNTER — Telehealth: Payer: Self-pay | Admitting: Oncology

## 2011-08-03 NOTE — Telephone Encounter (Signed)
Called pt,left message for lab and MD visit for end of January 2013

## 2011-08-31 ENCOUNTER — Other Ambulatory Visit: Payer: 59 | Admitting: Lab

## 2011-09-03 ENCOUNTER — Ambulatory Visit (HOSPITAL_BASED_OUTPATIENT_CLINIC_OR_DEPARTMENT_OTHER): Payer: BC Managed Care – PPO

## 2011-09-03 DIAGNOSIS — C50419 Malignant neoplasm of upper-outer quadrant of unspecified female breast: Secondary | ICD-10-CM

## 2011-09-03 LAB — CBC & DIFF AND RETIC
BASO%: 0.7 % (ref 0.0–2.0)
Basophils Absolute: 0 10*3/uL (ref 0.0–0.1)
Eosinophils Absolute: 0.2 10*3/uL (ref 0.0–0.5)
HCT: 32.6 % — ABNORMAL LOW (ref 34.8–46.6)
HGB: 10.6 g/dL — ABNORMAL LOW (ref 11.6–15.9)
Immature Retic Fract: 15.3 % — ABNORMAL HIGH (ref 1.60–10.00)
LYMPH%: 27.5 % (ref 14.0–49.7)
MONO#: 0.5 10*3/uL (ref 0.1–0.9)
NEUT#: 2.5 10*3/uL (ref 1.5–6.5)
NEUT%: 56.5 % (ref 38.4–76.8)
Platelets: 191 10*3/uL (ref 145–400)
WBC: 4.4 10*3/uL (ref 3.9–10.3)
lymph#: 1.2 10*3/uL (ref 0.9–3.3)

## 2011-09-03 LAB — CHCC SMEAR

## 2011-09-03 LAB — MORPHOLOGY: PLT EST: ADEQUATE

## 2011-09-03 LAB — COMPREHENSIVE METABOLIC PANEL
ALT: 13 U/L (ref 0–35)
Alkaline Phosphatase: 48 U/L (ref 39–117)
CO2: 25 mEq/L (ref 19–32)
Creatinine, Ser: 0.89 mg/dL (ref 0.50–1.10)
Total Bilirubin: 0.3 mg/dL (ref 0.3–1.2)

## 2011-09-04 ENCOUNTER — Ambulatory Visit: Payer: 59 | Admitting: Oncology

## 2011-09-07 ENCOUNTER — Ambulatory Visit (HOSPITAL_BASED_OUTPATIENT_CLINIC_OR_DEPARTMENT_OTHER): Payer: BC Managed Care – PPO | Admitting: Oncology

## 2011-09-07 VITALS — BP 154/84 | HR 80 | Temp 98.1°F | Ht 63.0 in | Wt 192.3 lb

## 2011-09-07 DIAGNOSIS — E559 Vitamin D deficiency, unspecified: Secondary | ICD-10-CM

## 2011-09-07 DIAGNOSIS — D649 Anemia, unspecified: Secondary | ICD-10-CM

## 2011-09-07 DIAGNOSIS — C50919 Malignant neoplasm of unspecified site of unspecified female breast: Secondary | ICD-10-CM

## 2011-09-07 NOTE — Progress Notes (Signed)
Hematology and Oncology Follow Up Visit  Renee [name] [facility] [id number] [birth year] 71 y.o. 09/07/2011 1:45 PM PCP Dr Carolynne Edouard, dr Michell Heinrich, dr Williemae Natter Katrinka Blazing  Principle Diagnosis: Lobular cancer s/p lumpectomy 4/12, s/p xrt completed 7/12, on arimidex Hx osteopenia on fosamax Hx hypertension/type 2 DM Hx mild anemia   Interim History:  There have been no intercurrent illness, hospitalizations or medication changes.  Medications: I have reviewed the patient's current medications.  Allergies:  Allergies  Allergen Reactions  . Sulfa Antibiotics   . Sulfonamide Derivatives     Past Medical History, Surgical history, Social history, and Family History were reviewed and updated.  Review of Systems: Constitutional:  Negative for fever, chills, night sweats, anorexia, weight loss, pain. Cardiovascular: no chest pain or dyspnea on exertion Respiratory: no cough, shortness of breath, or wheezing Neurological: no TIA or stroke symptoms Dermatological: negative ENT: negative Skin Gastrointestinal: no abdominal pain, change in bowel habits, or black or bloody stools Genito-Urinary: no dysuria, trouble voiding, or hematuria Hematological and Lymphatic: negative Breast: negative for breast lumps Musculoskeletal: negative Remaining ROS negative.  Physical Exam: Blood pressure 154/84, pulse 80, temperature 98.1 F (36.7 C), temperature source Oral, height 5\' 3"  (1.6 m), weight 192 lb 4.8 oz (87.227 kg). ECOG: 0 General appearance: alert, cooperative and appears stated age Head: Normocephalic, without obvious abnormality, atraumatic Neck: no adenopathy, no carotid bruit, no JVD, supple, symmetrical, trachea midline and thyroid not enlarged, symmetric, no tenderness/mass/nodules Lymph nodes: Cervical, supraclavicular, and axillary nodes normal. Cardiac : regular rate and rhythm, no murmurs or gallops Pulmonary:clear to auscultation bilaterally Breasts: positive findings: persistent seroma lt  breast ~ 3-4 cm Abdomen:soft, non-tender; bowel sounds normal; no masses,  no organomegaly Extremities negative Neuro: alert, oriented, normal speech, no focal findings or movement disorder noted  Lab Results: Lab Results  Component Value Date   WBC 4.4 09/03/2011   HGB 10.6* 09/03/2011   HCT 32.6* 09/03/2011   MCV 90.6 09/03/2011   PLT 191 09/03/2011     Chemistry      Component Value Date/Time   NA 139 09/03/2011 1316   K 4.1 09/03/2011 1316   CL 104 09/03/2011 1316   CO2 25 09/03/2011 1316   BUN 24* 09/03/2011 1316   CREATININE 0.89 09/03/2011 1316      Component Value Date/Time   CALCIUM 9.4 09/03/2011 1316   ALKPHOS 48 09/03/2011 1316   AST 22 09/03/2011 1316   ALT 13 09/03/2011 1316   BILITOT 0.3 09/03/2011 1316      .pathology. Radiological Studies: chest X-ray n/a Mammogram Due 3/13 Bone density n/a  Impression and Plan: Renee [name] is doing well, seroma maybe sl smaller.. She is mildly anemic  And has not had a colonoscopy ever.. Her iron stores are borderline. i will see her in 6 months with f/u mammogram.   More than 50% of the visit was spent in patient-related counselling   Pierce Crane, MD 2/1/20131:45 PM

## 2011-10-08 ENCOUNTER — Encounter (INDEPENDENT_AMBULATORY_CARE_PROVIDER_SITE_OTHER): Payer: 59 | Admitting: General Surgery

## 2011-10-25 ENCOUNTER — Ambulatory Visit
Admission: RE | Admit: 2011-10-25 | Discharge: 2011-10-25 | Disposition: A | Discharge status: Home / Self Care | Payer: BC Managed Care – PPO | Source: Ambulatory Visit | Attending: Oncology | Admitting: Oncology

## 2011-10-25 ENCOUNTER — Encounter (INDEPENDENT_AMBULATORY_CARE_PROVIDER_SITE_OTHER): Payer: Self-pay | Admitting: General Surgery

## 2011-10-25 DIAGNOSIS — E559 Vitamin D deficiency, unspecified: Secondary | ICD-10-CM

## 2011-10-25 DIAGNOSIS — C50919 Malignant neoplasm of unspecified site of unspecified female breast: Secondary | ICD-10-CM

## 2011-10-30 ENCOUNTER — Ambulatory Visit (INDEPENDENT_AMBULATORY_CARE_PROVIDER_SITE_OTHER): Payer: BC Managed Care – PPO | Admitting: General Surgery

## 2011-10-30 ENCOUNTER — Encounter (INDEPENDENT_AMBULATORY_CARE_PROVIDER_SITE_OTHER): Payer: Self-pay | Admitting: General Surgery

## 2011-10-30 VITALS — BP 138/90 | HR 68 | Temp 97.0°F | Resp 18 | Ht 59.0 in | Wt 189.2 lb

## 2011-10-30 DIAGNOSIS — C50919 Malignant neoplasm of unspecified site of unspecified female breast: Secondary | ICD-10-CM

## 2011-10-30 NOTE — Patient Instructions (Signed)
Continue regular self exam Continue arimidex

## 2011-10-30 NOTE — Progress Notes (Signed)
Subjective:     Patient ID: Renee [name], female   DOB: Jan 30, 1941, 71 y.o.   MRN: [id number]  HPI The patient is a 71-year-old white female who is about 10 months out now from a left breast lumpectomy and negative sentinel node biopsy followed by radiation. She is also taking Arimidex and seems to be tolerating it well. Her only complaint is of some soreness of the left breast with persistent fullness at the operative site. She has had a seroma there since surgery. We have tried to aspirate it in the past and have been unsuccessful at getting much fluid out. She also recently had a mammogram which she was told was negative.  Review of Systems  Constitutional: Negative.   HENT: Negative.   Eyes: Negative.   Respiratory: Negative.   Cardiovascular: Negative.   Gastrointestinal: Negative.   Genitourinary: Negative.   Musculoskeletal: Negative.   Skin: Negative.   Neurological: Negative.   Hematological: Negative.   Psychiatric/Behavioral: Negative.        Objective:   Physical Exam  Constitutional: She is oriented to person, place, and time. She appears well-developed and well-nourished.  HENT:  Head: Normocephalic and atraumatic.  Eyes: Conjunctivae and EOM are normal. Pupils are equal, round, and reactive to light.  Neck: Normal range of motion. Neck supple.  Cardiovascular: Normal rate, regular rhythm and normal heart sounds.   Pulmonary/Chest: Effort normal and breath sounds normal.       She has a palpable fullness at the lumpectomy site that is stable. No other palpable mass in the left breast. No palpable mass in the right breast. No axillary supraclavicular or cervical lymphadenopathy.  Abdominal: Soft. Bowel sounds are normal. She exhibits no mass. There is no tenderness.  Musculoskeletal: Normal range of motion.  Lymphadenopathy:    She has no cervical adenopathy.  Neurological: She is alert and oriented to person, place, and time.  Skin: Skin is warm and dry.    Psychiatric: She has a normal mood and affect. Her behavior is normal.       Assessment:     10 months status post left breast lumpectomy and negative sentinel node biopsy    Plan:     She will continue her Arimidex as well as regular self exams. We will continue to watch the seroma as it is very stable. We will plan to see her back in about 3 months.

## 2011-11-16 ENCOUNTER — Other Ambulatory Visit: Payer: Self-pay | Admitting: Gastroenterology

## 2012-01-21 ENCOUNTER — Telehealth (INDEPENDENT_AMBULATORY_CARE_PROVIDER_SITE_OTHER): Payer: Self-pay | Admitting: General Surgery

## 2012-01-21 NOTE — Telephone Encounter (Signed)
LMOM letting pt know that I had to reschedule her apt with Dr. Carolynne Edouard from 7/23 at 9:50 to 7/26 at 1:40.  I also sent a reminder card in the mail.

## 2012-01-31 ENCOUNTER — Encounter (INDEPENDENT_AMBULATORY_CARE_PROVIDER_SITE_OTHER): Payer: BC Managed Care – PPO | Admitting: General Surgery

## 2012-02-05 ENCOUNTER — Encounter (INDEPENDENT_AMBULATORY_CARE_PROVIDER_SITE_OTHER): Payer: BC Managed Care – PPO | Admitting: General Surgery

## 2012-02-21 ENCOUNTER — Encounter (INDEPENDENT_AMBULATORY_CARE_PROVIDER_SITE_OTHER): Payer: BC Managed Care – PPO | Admitting: General Surgery

## 2012-02-26 ENCOUNTER — Encounter (INDEPENDENT_AMBULATORY_CARE_PROVIDER_SITE_OTHER): Payer: BC Managed Care – PPO | Admitting: General Surgery

## 2012-02-29 ENCOUNTER — Encounter (INDEPENDENT_AMBULATORY_CARE_PROVIDER_SITE_OTHER): Payer: BC Managed Care – PPO | Admitting: General Surgery

## 2012-03-11 ENCOUNTER — Ambulatory Visit (INDEPENDENT_AMBULATORY_CARE_PROVIDER_SITE_OTHER): Payer: BC Managed Care – PPO | Admitting: General Surgery

## 2012-03-11 ENCOUNTER — Encounter (INDEPENDENT_AMBULATORY_CARE_PROVIDER_SITE_OTHER): Payer: Self-pay | Admitting: General Surgery

## 2012-03-11 VITALS — BP 132/78 | HR 64 | Temp 97.9°F | Resp 16 | Ht 59.0 in | Wt 191.0 lb

## 2012-03-11 DIAGNOSIS — C50919 Malignant neoplasm of unspecified site of unspecified female breast: Secondary | ICD-10-CM

## 2012-03-11 NOTE — Patient Instructions (Signed)
Continue regular self exams Continue arimidex 

## 2012-04-15 NOTE — Progress Notes (Signed)
Subjective:     Patient ID: Renee [name], female   DOB: 1941-05-03, 71 y.o.   MRN: [id number]  HPI The patient is a 71 year old white female who is one year and 3 months out from a left breast lumpectomy and negative sentinel node biopsy. Since her last visit she has been well. She has no complaints today. She does take Arimidex and tolerates it well.  Review of Systems  Constitutional: Negative.   HENT: Negative.   Eyes: Negative.   Respiratory: Negative.   Cardiovascular: Negative.   Gastrointestinal: Negative.   Genitourinary: Negative.   Musculoskeletal: Negative.   Skin: Negative.   Neurological: Negative.   Hematological: Negative.   Psychiatric/Behavioral: Negative.        Objective:   Physical Exam  Constitutional: She is oriented to person, place, and time. She appears well-developed and well-nourished.  HENT:  Head: Normocephalic and atraumatic.  Eyes: Conjunctivae and EOM are normal. Pupils are equal, round, and reactive to light.  Neck: Normal range of motion. Neck supple.  Cardiovascular: Normal rate, regular rhythm and normal heart sounds.   Pulmonary/Chest: Effort normal and breath sounds normal.       She still has some palpable fullness at the operative site of the left breast. The incision has healed well. There is no other palpable mass in either breast. No palpable axillary supraclavicular or cervical lymphadenopathy.  Abdominal: Soft. Bowel sounds are normal.  Musculoskeletal: Normal range of motion.  Lymphadenopathy:    She has no cervical adenopathy.  Neurological: She is alert and oriented to person, place, and time.  Skin: Skin is warm and dry.  Psychiatric: She has a normal mood and affect. Her behavior is normal.       Assessment:     1 year and 3 months status post left breast lumpectomy and negative sentinel node biopsy    Plan:     At this point she will continue to do regular self exams. She will  Continue to take her room asked daily.  We will plan to see her back in about 3 months.

## 2012-05-06 IMAGING — CT CT CHEST W/ CM
2 of 3 series · 15 of 36 positions shown, 18 images · IV contrast (agent unspecified)
Comparison: Plain films of 05/06/2009.  No prior CT. .Breast MR
11/10/2010 is reviewed.

CLINICAL DATA: Left breast cancer status post diagnosis [DATE].  No
treatments yet.  No complaints.

CT CHEST WITH CONTRAST
TECHNIQUE: Multidetector CT imaging of the chest was performed
following the standard protocol during bolus administration of
intravenous contrast.
Contrast: 80 ml [id number]

[Series 2: chest with st · axial · 0.74mm/px · z∈[-304,-60]mm · 12 of 59 slices shown, 15 images]
[im 5/59  mediastinal]
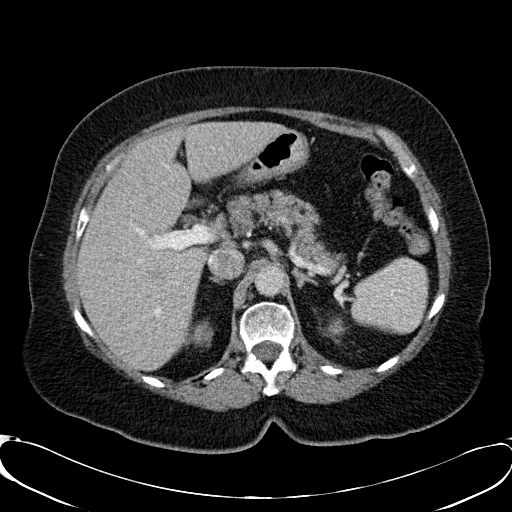
[im 5/59  lung]
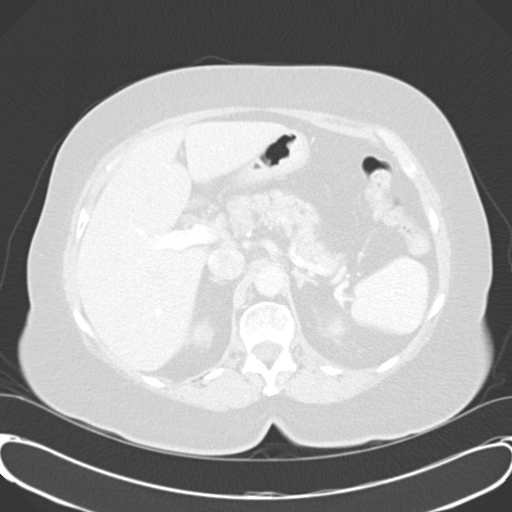
[im 9/59  lung]
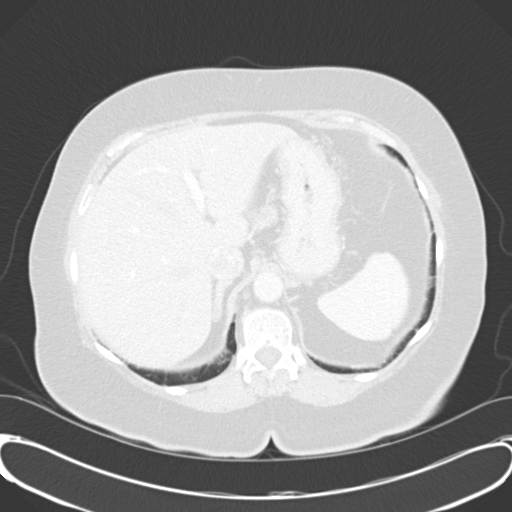
[im 13/59  lung]
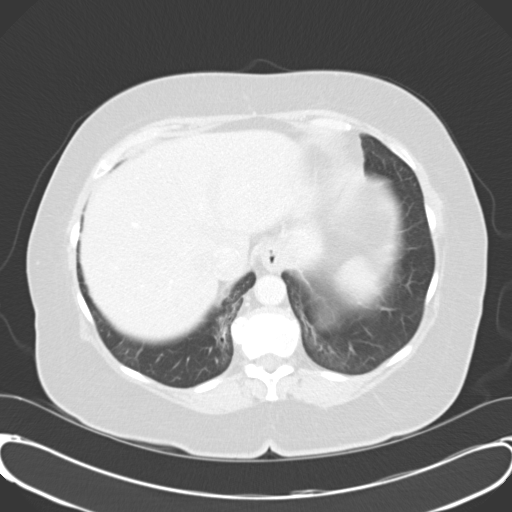
[im 18/59  lung]
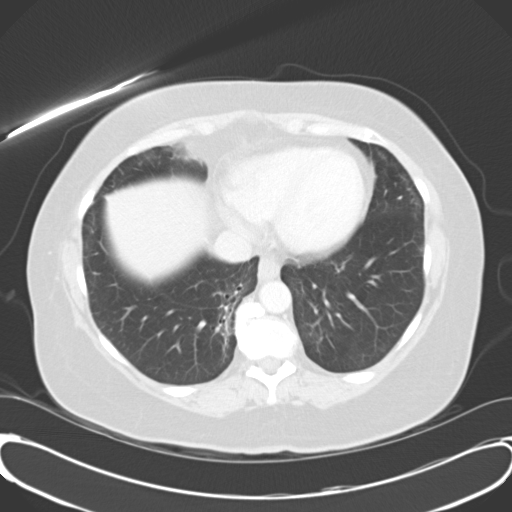
[im 22/59  mediastinal]
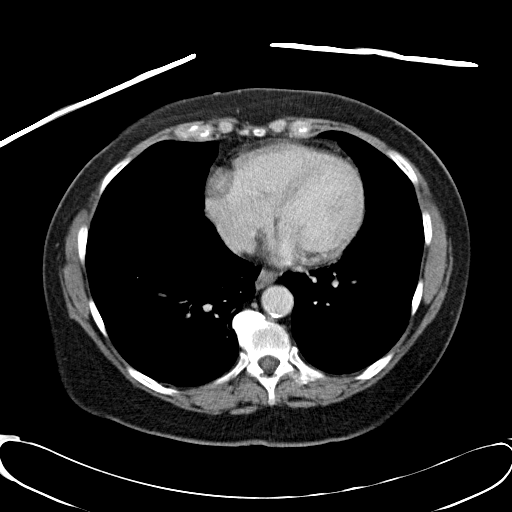
[im 22/59  lung]
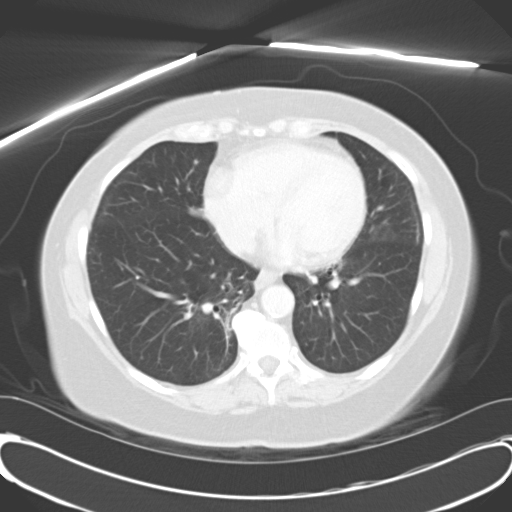
[im 26/59  lung]
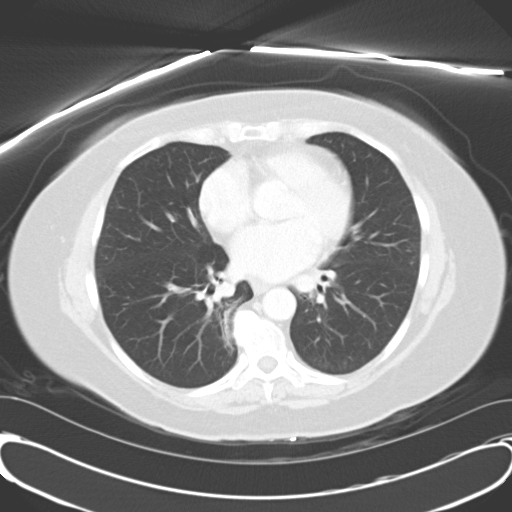
[im 33/59  lung]
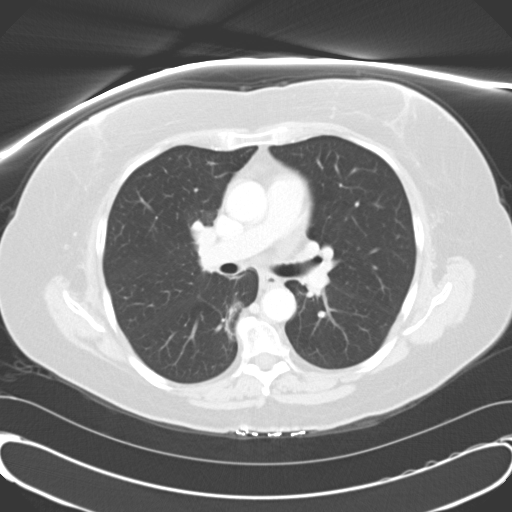
[im 37/59  lung]
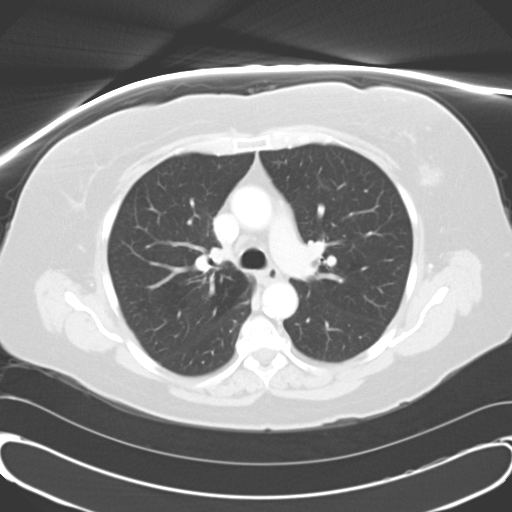
[im 41/59  mediastinal]
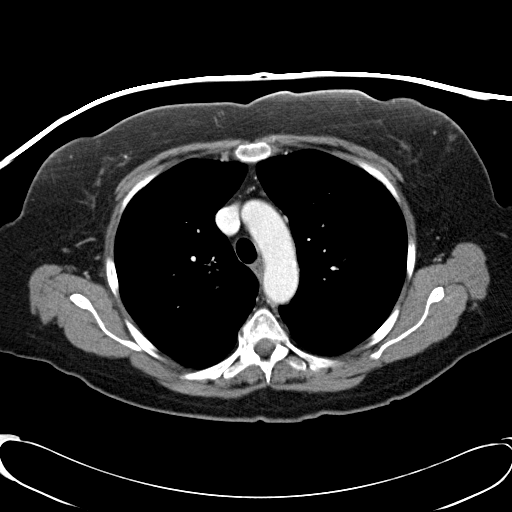
[im 41/59  lung]
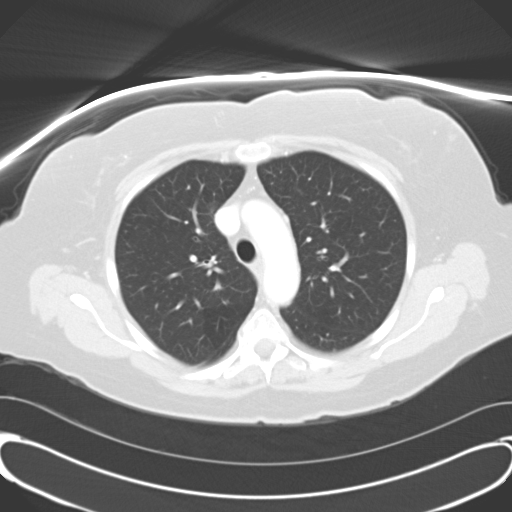
[im 46/59  lung]
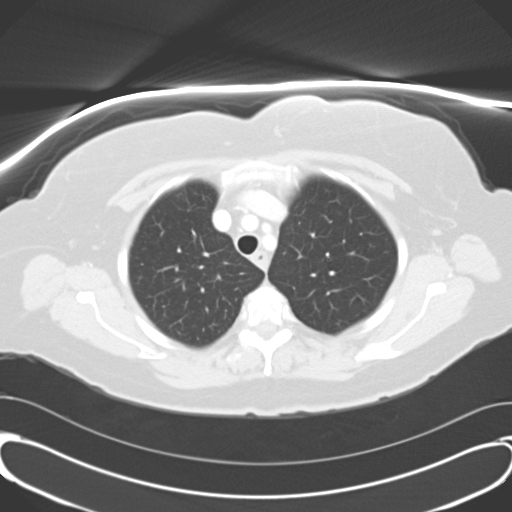
[im 50/59  lung]
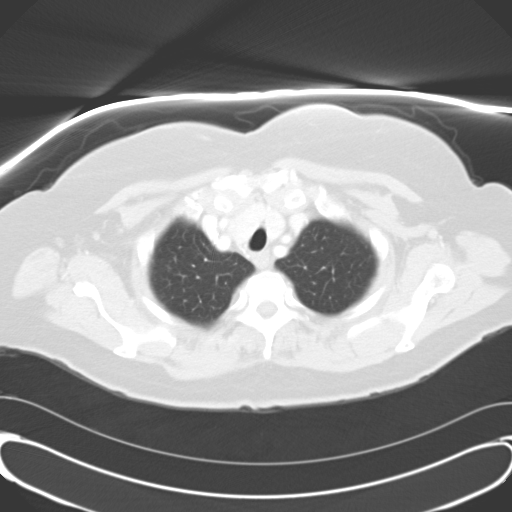
[im 54/59  lung]
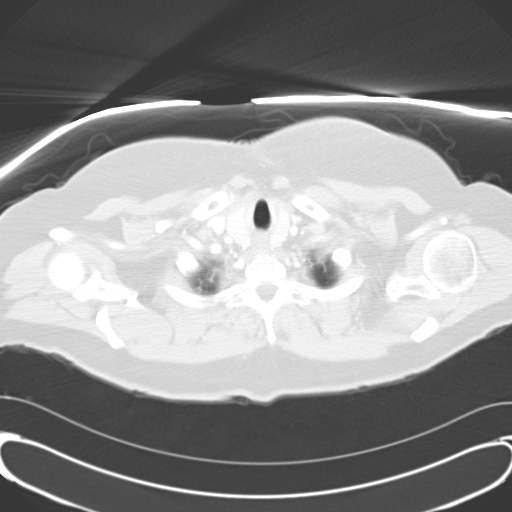

[Series 602: <mpr thick range> · coronal · 0.74mm/px · 3 of 92 slices shown]
[im 19/92  lung]
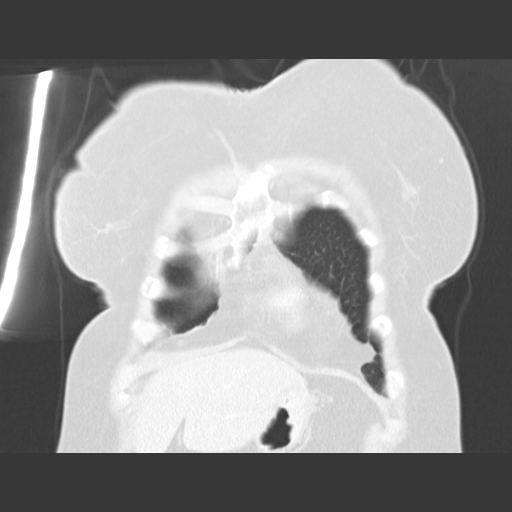
[im 37/92  lung]
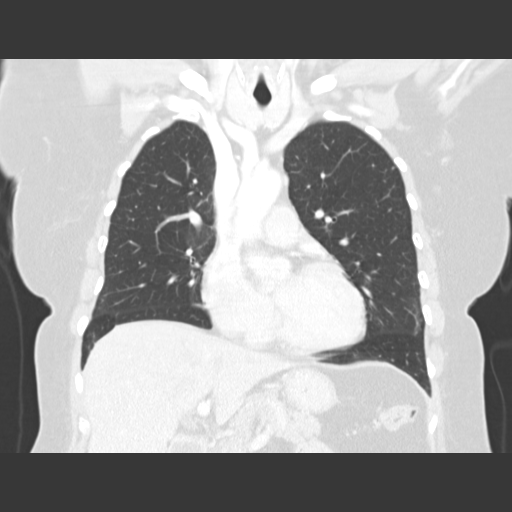
[im 55/92  lung]
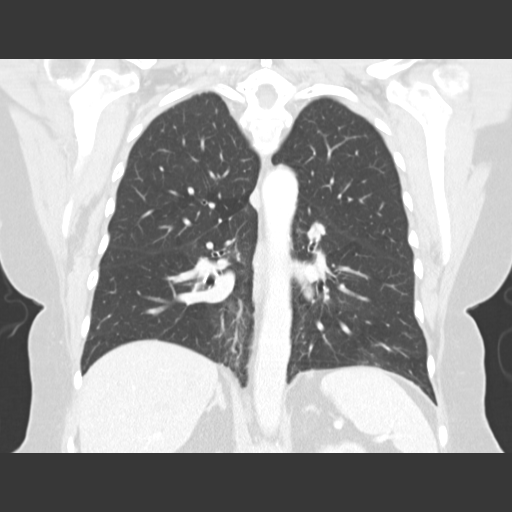

[15 of 36 positions shown; findings below may reference images not displayed]

FINDINGS: Lung windows demonstrate right middle lobe lung nodule
which measures 3 mm on image 38.
Mild medial lung base scarring bilaterally.

Soft tissue windows demonstrate probable left breast primary on
image 23 measuring 1.4 cm. No axillary adenopathy.  Normal heart
size without pericardial or pleural effusion.  Coronary artery
atherosclerosis, including within the LAD and likely proximal left
circumflex on image 30.

No mediastinal or hilar adenopathy.

Limited abdominal imaging demonstrates cholecystectomy.
Underdistended proximal stomach. Normal adrenal glands.  No acute
osseous abnormality.
IMPRESSION: 1.  Left breast primary without evidence of metastatic disease or
acute process in the chest.
2.  3 mm right middle lobe lung nodule is nonspecific but can be
followed on subsequent exams.

## 2012-06-10 ENCOUNTER — Ambulatory Visit (INDEPENDENT_AMBULATORY_CARE_PROVIDER_SITE_OTHER): Payer: BC Managed Care – PPO | Admitting: General Surgery

## 2012-06-10 ENCOUNTER — Encounter (INDEPENDENT_AMBULATORY_CARE_PROVIDER_SITE_OTHER): Payer: Self-pay | Admitting: General Surgery

## 2012-06-10 VITALS — BP 122/70 | HR 66 | Temp 97.6°F | Ht 60.0 in | Wt 193.4 lb

## 2012-06-10 DIAGNOSIS — C50919 Malignant neoplasm of unspecified site of unspecified female breast: Secondary | ICD-10-CM

## 2012-06-10 NOTE — Patient Instructions (Signed)
Continue regular self exams Continue arimidex 

## 2012-06-12 ENCOUNTER — Encounter (INDEPENDENT_AMBULATORY_CARE_PROVIDER_SITE_OTHER): Payer: Self-pay | Admitting: General Surgery

## 2012-06-12 NOTE — Progress Notes (Signed)
Subjective:     Patient ID: Renee [name], female   DOB: 1940/08/19, 71 y.o.   MRN: [id number]  HPI The patient is a 71 year old white female who is 1-1/2 years status post left breast lumpectomy and negative sentinel node biopsy for a T2 N0 left breast cancer. She was ER positive. She is taking Arimidex and tolerating that well. Other than some minor soreness she has no complaints today.  Review of Systems  Constitutional: Negative.   HENT: Negative.   Eyes: Negative.   Respiratory: Negative.   Cardiovascular: Negative.   Gastrointestinal: Negative.   Genitourinary: Negative.   Musculoskeletal: Positive for arthralgias.  Skin: Negative.   Neurological: Negative.   Hematological: Negative.   Psychiatric/Behavioral: Negative.        Objective:   Physical Exam  Constitutional: She is oriented to person, place, and time. She appears well-developed and well-nourished.  HENT:  Head: Normocephalic and atraumatic.  Eyes: Conjunctivae normal and EOM are normal. Pupils are equal, round, and reactive to light.  Neck: Normal range of motion. Neck supple.  Cardiovascular: Normal rate, regular rhythm and normal heart sounds.   Pulmonary/Chest: Effort normal and breath sounds normal.       The patient has some thickness to the scar in the left breast. Otherwise her incision is healing nicely. There is no other palpable mass in either breast. There is no palpable axillary supraclavicular or cervical lymphadenopathy.  Abdominal: Soft. Bowel sounds are normal. She exhibits no mass. There is no tenderness.  Musculoskeletal: Normal range of motion.  Lymphadenopathy:    She has no cervical adenopathy.  Neurological: She is alert and oriented to person, place, and time.  Skin: Skin is warm and dry.  Psychiatric: She has a normal mood and affect. Her behavior is normal.       Assessment:     1-1/2 years status post left breast lumpectomy    Plan:     At this point she seems to be doing  well. She will continue to do regular self exams. She will continue to take Arimidex. We will plan to see her back in about 3 months.

## 2012-06-26 ENCOUNTER — Other Ambulatory Visit: Payer: Self-pay | Admitting: *Deleted

## 2012-06-26 ENCOUNTER — Other Ambulatory Visit: Payer: Self-pay | Admitting: Oncology

## 2012-06-26 NOTE — Telephone Encounter (Signed)
Patient needs to r/s Mammogram and MD visits

## 2012-06-26 NOTE — Telephone Encounter (Signed)
No appt kept from 03/2012, last OV 09/2011. Patient to scheduled appt and will be glad to call in 30day supply to local pharmacy. Left message to return call for appts and name of local pharmacy.

## 2012-07-07 ENCOUNTER — Other Ambulatory Visit: Payer: Self-pay | Admitting: *Deleted

## 2012-07-07 DIAGNOSIS — C50919 Malignant neoplasm of unspecified site of unspecified female breast: Secondary | ICD-10-CM

## 2012-07-07 MED ORDER — ANASTROZOLE 1 MG PO TABS: 1.0000 mg | ORAL_TABLET | Freq: Every day | ORAL | Status: DC

## 2012-07-07 NOTE — Telephone Encounter (Signed)
PT. WILL CALL SCHEDULER TO CHANGE TO A MORNING APPOINTMENT

## 2012-07-10 ENCOUNTER — Telehealth (INDEPENDENT_AMBULATORY_CARE_PROVIDER_SITE_OTHER): Payer: Self-pay | Admitting: General Surgery

## 2012-07-10 ENCOUNTER — Other Ambulatory Visit: Payer: Self-pay | Admitting: *Deleted

## 2012-07-10 ENCOUNTER — Other Ambulatory Visit: Payer: Self-pay | Admitting: Oncology

## 2012-07-10 DIAGNOSIS — C50919 Malignant neoplasm of unspecified site of unspecified female breast: Secondary | ICD-10-CM

## 2012-07-10 MED ORDER — ANASTROZOLE 1 MG PO TABS: 1.0000 mg | ORAL_TABLET | Freq: Every day | ORAL | Status: DC

## 2012-07-10 NOTE — Telephone Encounter (Signed)
Returned patients call and she wanted to inform me that she wanted to let Dr. Carolynne Edouard know that last week she was having some stomach discomfort.  She went to her PCP and they didn't seem to think it was anything except for a pulled muscle.  She said it is resolving now but that she just wanted to keep Dr. Carolynne Edouard up-to-date.

## 2012-07-10 NOTE — Telephone Encounter (Signed)
Message copied by Littie Deeds on Thu Jul 10, 2012 12:13 PM ------      Message from: Isaias Sakai K      Created: Thu Jul 10, 2012 10:23 AM      Regarding: Dr Alvester Morin: [phone]       Questions about stomach pains (non urgent).Would like a call before 1 pm

## 2012-07-15 ENCOUNTER — Other Ambulatory Visit: Payer: Self-pay | Admitting: *Deleted

## 2012-07-15 DIAGNOSIS — C50919 Malignant neoplasm of unspecified site of unspecified female breast: Secondary | ICD-10-CM

## 2012-07-15 MED ORDER — ALENDRONATE SODIUM 35 MG PO TABS: 35.0000 mg | ORAL_TABLET | ORAL | Status: DC

## 2012-07-15 NOTE — Telephone Encounter (Signed)
Patient called asking for a ninety day supply of this medicine.  [facility] called her reporting they cannot fill was was previously submitted for ordering.  New order sent today.  Patient also asked for a morning appointment.  Call transferred to scheduler.

## 2012-08-01 ENCOUNTER — Telehealth: Payer: Self-pay | Admitting: *Deleted

## 2012-08-01 NOTE — Telephone Encounter (Signed)
patient confirmed over the phone the cancelled appointment

## 2012-08-08 ENCOUNTER — Telehealth: Payer: Self-pay | Admitting: *Deleted

## 2012-08-08 NOTE — Telephone Encounter (Signed)
Pt called back stating that she has done her research and she wants to see Dr. Cyndie Chime.  Told her that I do not schedule for him and that I would get her information to the person that does and someone would call her back.  See was ok w/ that.

## 2012-08-25 ENCOUNTER — Telehealth: Payer: Self-pay | Admitting: Oncology

## 2012-08-25 NOTE — Telephone Encounter (Signed)
S/W pt in re appt w/Dr. Cyndie Chime 2/26 @ 9:30.  Calendar mailed.

## 2012-08-26 ENCOUNTER — Ambulatory Visit: Payer: BC Managed Care – PPO | Admitting: Oncology

## 2012-08-28 ENCOUNTER — Ambulatory Visit (INDEPENDENT_AMBULATORY_CARE_PROVIDER_SITE_OTHER): Payer: Medicare Other | Admitting: General Surgery

## 2012-08-28 VITALS — BP 160/80 | HR 84 | Temp 97.5°F | Resp 18 | Ht 60.0 in | Wt 193.8 lb

## 2012-08-28 DIAGNOSIS — C50919 Malignant neoplasm of unspecified site of unspecified female breast: Secondary | ICD-10-CM

## 2012-08-28 DIAGNOSIS — N63 Unspecified lump in unspecified breast: Secondary | ICD-10-CM

## 2012-08-28 DIAGNOSIS — N6320 Unspecified lump in the left breast, unspecified quadrant: Secondary | ICD-10-CM

## 2012-08-28 NOTE — Patient Instructions (Signed)
Will get mammogram and ultrasound Will refer to Dr. Yetta Barre

## 2012-09-09 ENCOUNTER — Ambulatory Visit
Admission: RE | Admit: 2012-09-09 | Discharge: 2012-09-09 | Disposition: A | Discharge status: Home / Self Care | Payer: Medicare Other | Source: Ambulatory Visit | Attending: General Surgery | Admitting: General Surgery

## 2012-09-09 DIAGNOSIS — C50919 Malignant neoplasm of unspecified site of unspecified female breast: Secondary | ICD-10-CM

## 2012-09-16 ENCOUNTER — Encounter (INDEPENDENT_AMBULATORY_CARE_PROVIDER_SITE_OTHER): Payer: BC Managed Care – PPO | Admitting: General Surgery

## 2012-10-01 ENCOUNTER — Other Ambulatory Visit (HOSPITAL_BASED_OUTPATIENT_CLINIC_OR_DEPARTMENT_OTHER): Payer: Medicare Other | Admitting: Lab

## 2012-10-01 ENCOUNTER — Encounter: Payer: Self-pay | Admitting: Oncology

## 2012-10-01 ENCOUNTER — Telehealth: Payer: Self-pay | Admitting: Oncology

## 2012-10-01 ENCOUNTER — Ambulatory Visit (HOSPITAL_BASED_OUTPATIENT_CLINIC_OR_DEPARTMENT_OTHER): Payer: Medicare Other | Admitting: Oncology

## 2012-10-01 VITALS — BP 149/75 | HR 63 | Temp 97.1°F | Resp 18 | Ht 60.0 in | Wt 190.9 lb

## 2012-10-01 DIAGNOSIS — I10 Essential (primary) hypertension: Secondary | ICD-10-CM

## 2012-10-01 DIAGNOSIS — E119 Type 2 diabetes mellitus without complications: Secondary | ICD-10-CM

## 2012-10-01 DIAGNOSIS — M1990 Unspecified osteoarthritis, unspecified site: Secondary | ICD-10-CM

## 2012-10-01 DIAGNOSIS — Z170 Estrogen receptor positive status [ER+]: Secondary | ICD-10-CM

## 2012-10-01 DIAGNOSIS — C50419 Malignant neoplasm of upper-outer quadrant of unspecified female breast: Secondary | ICD-10-CM

## 2012-10-01 HISTORY — DX: Essential (primary) hypertension: I10

## 2012-10-01 HISTORY — DX: Type 2 diabetes mellitus without complications: E11.9

## 2012-10-01 NOTE — Telephone Encounter (Signed)
Gave pt appt for lab and MD on February 2015 °

## 2012-10-01 NOTE — Progress Notes (Signed)
Subjective:     Patient ID: Renee [name], female   DOB: Dec 11, 1940, 72 y.o.   MRN: [id number]  HPI The patient is a 72 year old white female who is one year and 9 months status post left breast lumpectomy and negative sentinel node biopsy for a T2 N0 breast cancer. She continues to take anastrozole and is tolerating that well. Her main complaint is of some pain in her neck and shoulder area. She continues to have a lump under her left arm.  Review of Systems  Constitutional: Negative.   HENT: Positive for neck pain and neck stiffness.   Eyes: Negative.   Respiratory: Negative.   Cardiovascular: Negative.   Gastrointestinal: Negative.   Endocrine: Negative.   Genitourinary: Negative.   Skin: Negative.   Allergic/Immunologic: Negative.   Neurological: Negative.   Hematological: Negative.   Psychiatric/Behavioral: Negative.        Objective:   Physical Exam  Constitutional: She is oriented to person, place, and time. She appears well-developed and well-nourished.  HENT:  Head: Normocephalic and atraumatic.  Eyes: Conjunctivae and EOM are normal. Pupils are equal, round, and reactive to light.  Neck: Normal range of motion. Neck supple.  Cardiovascular: Normal rate, regular rhythm and normal heart sounds.   Pulmonary/Chest: Effort normal and breath sounds normal.  She still has some tightness to the scar of the left breast. Otherwise there is no palpable mass in either breast. She continues to have a small lump palpable in the left axilla which I believe is residual seroma. There is no other palpable axillary or supraclavicular cervical lymphadenopathy  Abdominal: Soft. Bowel sounds are normal. She exhibits no mass. There is no tenderness.  Musculoskeletal: Normal range of motion.  Lymphadenopathy:    She has no cervical adenopathy.  Neurological: She is alert and oriented to person, place, and time.  Skin: Skin is warm and dry.  Psychiatric: She has a normal mood and affect. Her  behavior is normal.       Assessment:     The patient is one year and 9 months status post left breast lumpectomy for breast cancer.     Plan:     At this point she will continue to take anastrozole. She will continue to do regular self exams. We will plan to repeat a mammogram and ultrasound. We will also refer her to Dr. Yetta Barre and neurosurgery to evaluate her neck pain

## 2012-10-01 NOTE — Progress Notes (Signed)
Hematology and Oncology Follow Up Visit  Renee [name] [facility] [id number] 11-20-1940 72 y.o. 10/01/2012 11:28 AM   Principle Diagnosis: Encounter Diagnoses  Name Primary?  . Breast cancer, left Yes  . HTN (hypertension), benign   . DM type 2 (diabetes mellitus, type 2)   . DJD (degenerative joint disease)      Interim History:   I will be assuming this patient's care since Dr Donnie Coffin left the practice.  This is a pleasant 72 year old postmenopausal woman who was found to have a suspicious mass in the left breast on a routine mammogram done 10/24/2010.. She underwent a lumpectomy with sentinel lymph node dissection on 11/28/2010. 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 Estrogen receptor 96%, progesterone receptor 0,  Ki- 67 9%, HER-2 negative by CISH, Oncotype DX score 15. She received postop radiation and then was started on hormonal therapy with Arimidex which she continues at this time. She did develop a postop seroma and had to have a number of aspirations done. She developed a dense scar in the area of the lumpectomy. This seemed to be getting worse over time. She was recently reevaluated by her general surgeon. Mammogram done on 09/09/2012 showed no areas of concern. There is no family history of breast cancer in her mother who died at age 61, an older sister age 46.  Past medical history includes: Hypertension, type 2 diabetes, previous cholecystectomy, bilateral tubal ligation, low back surgery, bilateral cataract extractions.   Medications: Fosamax 35 mg every week, Arimidex 1 mg daily D, Lipitor 10 mg daily, calcium with vitamin D 250-100 one tablet daily, Toprol-XL 100 mg daily, multivitamins one daily, Actoplus-metformin 15/500 one by mouth twice a day, Diovan HCT 160-25 one daily  Allergies:  Allergies  Allergen Reactions  . Sulfa Antibiotics     Obtain reaction  from patient.  . Sulfonamide Derivatives     Obtain reaction from patient.    Review of Systems: Constitutional:   No constitutional symptoms Respiratory: No cough or dyspnea Cardiovascular:  No chest pain or palpitations Gastrointestinal: No change in bowel habit. Colonoscopy done in 11/16/2011 small hyperplastic polyp removed Genito-Urinary: Remote history of recurrent urinary tract infection none recently. No vaginal bleeding. Musculoskeletal: Degenerative arthritis primarily knees and back, she gets muscle cramps of her feet Neurologic: No headache or change in vision Skin: No rash or ecchymosis Remaining ROS negative.  Physical Exam: Blood pressure 149/75, pulse 63, temperature 97.1 F (36.2 C), temperature source Oral, resp. rate 18, height 5' (1.524 m), weight 190 lb 14.4 oz (86.592 kg). Wt Readings from Last 3 Encounters:  10/01/12 190 lb 14.4 oz (86.592 kg)  08/28/12 193 lb 12.8 oz (87.907 kg)  06/10/12 193 lb 6.4 oz (87.726 kg)     General appearance: Well-nourished Caucasian woman HENNT: Pharynx no erythema exudate or mass Lymph nodes: No cervical, supraclavicular, or axillary adenopathy Breasts: Surgical changes left breast, linear nodular scar at lumpectomy site, inverted nipple. Right breast normal no mass and nipple external Lungs: Clear to auscultation resonant to percussion Heart: Regular rhythm no murmur Abdomen: Soft, nontender, no mass, no organomegaly Extremities: No edema, no calf tenderness Vascular: No cyanosis Neurologic: She is alert and oriented, PERRLA, optic disc sharp on the left , vessels normal, disc not visualized well on the right, motor strength 5 over 5, reflexes 2+ symmetric, sensation intact to vibration over the fingertips Skin: No rash or ecchymosis Lab  Results: Lab Results  Component Value Date   WBC 4.4 09/03/2011   HGB 10.6* 09/03/2011   HCT 32.6* 09/03/2011   MCV 90.6 09/03/2011   PLT 191 09/03/2011     Chemistry      Component  Value Date/Time   NA 139 09/03/2011 1316   K 4.1 09/03/2011 1316   CL 104 09/03/2011 1316   CO2 25 09/03/2011 1316   BUN 24* 09/03/2011 1316   CREATININE 0.89 09/03/2011 1316      Component Value Date/Time   CALCIUM 9.4 09/03/2011 1316   ALKPHOS 48 09/03/2011 1316   AST 22 09/03/2011 1316   ALT 13 09/03/2011 1316   BILITOT 0.3 09/03/2011 1316       Radiological Studies: Mm Digital Diagnostic Unilat L  09/09/2012  *RADIOLOGY REPORT*  Clinical Data:  Malignant lumpectomy of the left breast in April, 2012, with subsequent radiation therapy.  The patient presents today with left breast pain which the patient states is improved as on the weekends when she is not at work (where she uses her left arm extensively).  DIGITAL DIAGNOSTIC LEFT MAMMOGRAM WITH CAD  Comparison: 10/25/2011, 03/26/2011, dating back to 10/29/2006.  Findings:  ACR Breast Density Category 2: There is a scattered fibroglandular pattern.  CC and MLO views of the left breast were obtained.  Post lumpectomy scarring in the upper outer left breast.  Circumscribed mass at the lumpectomy site consistent with a seroma, with further interval decrease in size since the most recent examination from March, 2013.  Scattered benign appearing secretory type calcifications in the left breast.  No new suspicious mass, nonsurgical architectural distortion or suspicious calcifications in the left breast.  Mammographic images were processed with CAD.  IMPRESSION: No specific mammographic evidence of malignancy, left breast.  Post lumpectomy scarring in the upper outer left breast.  A seroma at the lumpectomy site which has shown further decrease in size since the most recent prior examination.  RECOMMENDATION: Bilateral diagnostic mammography which is due in late March, 2014.  We discussed the possibility of aspiration of the seroma.  However, the patient has elected to wait and see if her pain becomes better after she stops working and is no longer using the left  arm repetitively.  She was told to [facility] if she changes her mind and wishes to have this performed.  I have discussed the findings and recommendations with the patient. Results were also provided in writing at the conclusion of the visit.  BI-RADS CATEGORY 2:  Benign finding(s).   Original Report Authenticated By: Hulan Saas, M.D.     Impression and Plan: #1. 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 She remains stable without any obvious new disease now almost 2 years from initial diagnosis. Plan: Continue Arimidex for a total of 5 years. I will see her again in one year. Mammograms and lab work prior to that visit.  #2. Type 2 diabetes on oral agent  #3. Essential hypertension  #4. Degenerative arthritis   CC:. Dr. Merri Brunette; Dr. Chevis Pretty; Dr. Lurline Hare   Levert Feinstein, MD 2/26/201411:28 AM

## 2012-10-01 NOTE — Patient Instructions (Signed)
Next mammogram 09/10/13; lab work same day MD visit 09/18/13 4PM

## 2012-10-27 ENCOUNTER — Ambulatory Visit
Admission: RE | Admit: 2012-10-27 | Discharge: 2012-10-27 | Disposition: A | Discharge status: Home / Self Care | Payer: Medicare Other | Source: Ambulatory Visit | Attending: Oncology | Admitting: Oncology

## 2012-10-27 DIAGNOSIS — C50912 Malignant neoplasm of unspecified site of left female breast: Secondary | ICD-10-CM

## 2012-10-28 ENCOUNTER — Telehealth: Payer: Self-pay | Admitting: *Deleted

## 2012-10-28 NOTE — Telephone Encounter (Signed)
Message copied by Orbie Hurst on Tue Oct 28, 2012  4:59 PM ------      Message from: Levert Feinstein      Created: Mon Oct 27, 2012  6:43 PM       Not my patient but mammograms normal ------

## 2012-10-28 NOTE — Telephone Encounter (Signed)
Spoke with patient.  Let her know mammogram is normal.  She appreciated the call.

## 2012-10-31 ENCOUNTER — Ambulatory Visit (INDEPENDENT_AMBULATORY_CARE_PROVIDER_SITE_OTHER): Payer: Medicare Other | Admitting: General Surgery

## 2012-10-31 ENCOUNTER — Encounter (INDEPENDENT_AMBULATORY_CARE_PROVIDER_SITE_OTHER): Payer: Self-pay | Admitting: General Surgery

## 2012-10-31 VITALS — BP 124/82 | HR 78 | Temp 97.2°F | Resp 14 | Ht 60.0 in | Wt 193.0 lb

## 2012-10-31 DIAGNOSIS — C50912 Malignant neoplasm of unspecified site of left female breast: Secondary | ICD-10-CM

## 2012-10-31 DIAGNOSIS — C50919 Malignant neoplasm of unspecified site of unspecified female breast: Secondary | ICD-10-CM

## 2012-10-31 NOTE — Patient Instructions (Signed)
Continue regular self exams Continue arimidex 

## 2012-10-31 NOTE — Progress Notes (Signed)
Subjective:     Patient ID: Renee [name], female   DOB: October 10, 1940, 72 y.o.   MRN: [id number]  HPI The patient is a 72 year old white female who is 2 years status post left breast lumpectomy and negative sentinel node biopsy for a T2 N0 left breast cancer. She has had a residual palpable seroma in the upper outer left breast. She denies any breast pain or discharge from her nipple. She is taking anastrozole and tolerating it well.  Review of Systems  Constitutional: Negative.   HENT: Negative.   Eyes: Negative.   Respiratory: Negative.   Cardiovascular: Negative.   Gastrointestinal: Negative.   Endocrine: Negative.   Genitourinary: Negative.   Musculoskeletal: Positive for arthralgias.  Skin: Negative.   Allergic/Immunologic: Negative.   Neurological: Negative.   Hematological: Negative.   Psychiatric/Behavioral: Negative.        Objective:   Physical Exam  Constitutional: She is oriented to person, place, and time. She appears well-developed and well-nourished.  HENT:  Head: Normocephalic and atraumatic.  Eyes: Conjunctivae and EOM are normal. Pupils are equal, round, and reactive to light.  Neck: Normal range of motion. Neck supple.  Cardiovascular: Normal rate, regular rhythm and normal heart sounds.   Pulmonary/Chest: Effort normal and breath sounds normal.  The palpable seroma in the upper outer left breast seems significantly smaller today. Otherwise there is no palpable mass in either breast. There is no palpable axillary or supraclavicular cervical lymphadenopathy.  Abdominal: Bowel sounds are normal. She exhibits no mass. There is no tenderness.  Musculoskeletal: Normal range of motion.  Lymphadenopathy:    She has no cervical adenopathy.  Neurological: She is alert and oriented to person, place, and time.  Skin: Skin is warm and dry.  Psychiatric: She has a normal mood and affect. Her behavior is normal.       Assessment:     The patient is 2 years status  post left breast lumpectomy for breast cancer     Plan:     At this point she will continue to do regular self exams. She will continue to take anastrozole. We will plan to see her back in about 6 months.

## 2012-11-20 ENCOUNTER — Other Ambulatory Visit: Payer: Self-pay | Admitting: *Deleted

## 2012-11-20 DIAGNOSIS — C50919 Malignant neoplasm of unspecified site of unspecified female breast: Secondary | ICD-10-CM

## 2012-11-20 MED ORDER — ALENDRONATE SODIUM 35 MG PO TABS: 35.0000 mg | ORAL_TABLET | ORAL | Status: DC

## 2012-11-20 MED ORDER — ANASTROZOLE 1 MG PO TABS: 1.0000 mg | ORAL_TABLET | Freq: Every day | ORAL | Status: DC

## 2013-04-09 ENCOUNTER — Ambulatory Visit (INDEPENDENT_AMBULATORY_CARE_PROVIDER_SITE_OTHER): Payer: Medicare Other | Admitting: General Surgery

## 2013-04-15 ENCOUNTER — Encounter (INDEPENDENT_AMBULATORY_CARE_PROVIDER_SITE_OTHER): Payer: Self-pay | Admitting: General Surgery

## 2013-04-15 ENCOUNTER — Ambulatory Visit (INDEPENDENT_AMBULATORY_CARE_PROVIDER_SITE_OTHER): Payer: Medicare Other | Admitting: General Surgery

## 2013-04-15 VITALS — BP 128/74 | HR 68 | Resp 14 | Ht 59.0 in | Wt 195.4 lb

## 2013-04-15 DIAGNOSIS — C50912 Malignant neoplasm of unspecified site of left female breast: Secondary | ICD-10-CM

## 2013-04-15 DIAGNOSIS — C50919 Malignant neoplasm of unspecified site of unspecified female breast: Secondary | ICD-10-CM

## 2013-04-15 NOTE — Patient Instructions (Signed)
Continue arimidex Continue regular self exams 

## 2013-04-16 NOTE — Progress Notes (Signed)
Subjective:     Patient ID: Renee [name], female   DOB: 12/09/1940, 72 y.o.   MRN: [id number]  HPI The patient is a 72 year old white female who is 2-1/2 years status post left breast lumpectomy and negative sentinel node biopsy for a T2 N0 left breast cancer. Since her last visit she has been well. She reports only some mild soreness which is stable in the upper outer left breast. She has had a persistent seroma in this location.  Review of Systems  Constitutional: Negative.   HENT: Negative.   Eyes: Negative.   Respiratory: Negative.   Cardiovascular: Negative.   Gastrointestinal: Negative.   Endocrine: Negative.   Genitourinary: Negative.   Musculoskeletal: Negative.   Skin: Negative.   Allergic/Immunologic: Negative.   Neurological: Negative.   Hematological: Negative.   Psychiatric/Behavioral: Negative.        Objective:   Physical Exam  Constitutional: She is oriented to person, place, and time. She appears well-developed and well-nourished.  HENT:  Head: Normocephalic and atraumatic.  Eyes: Conjunctivae and EOM are normal. Pupils are equal, round, and reactive to light.  Neck: Normal range of motion. Neck supple.  Cardiovascular: Normal rate, regular rhythm and normal heart sounds.   Pulmonary/Chest: Effort normal and breath sounds normal.  The palpable seroma in the upper outer left breast is significantly smaller. There is no palpable mass in either breast. There is no palpable axillary, supraclavicular, or cervical lymphadenopathy.  Abdominal: Soft. Bowel sounds are normal. She exhibits no mass. There is no tenderness.  Musculoskeletal: Normal range of motion.  Lymphadenopathy:    She has no cervical adenopathy.  Neurological: She is alert and oriented to person, place, and time.  Skin: Skin is warm and dry.  Psychiatric: She has a normal mood and affect. Her behavior is normal.       Assessment:     The patient is to have ear status post left breast  lumpectomy for breast cancer     Plan:     At this point she will continue to do regular self exams. She will continue to take anastrozole. We will plan to see her back in about 6 months.

## 2013-09-04 ENCOUNTER — Telehealth: Payer: Self-pay | Admitting: Oncology

## 2013-09-04 NOTE — Telephone Encounter (Signed)
pt returned call and made aware that she is being transferred for [place] to [place] and will see PK 2/3. pt understands that at 2/3 f/u PK will give the next f/u for GM. pt has new appt  time for 2/3 @ 12:30pm lb/PK

## 2013-09-04 NOTE — Telephone Encounter (Signed)
lmonvm for pt re calling me about changes being made to 2/3 appt. due to death in family for [place] and moving to [place] pt being moved from [place] to [place]. pt will see PK 2/3. pt given my name and direct number to call me back re the changes

## 2013-09-07 ENCOUNTER — Telehealth: Payer: Self-pay | Admitting: Oncology

## 2013-09-07 NOTE — Telephone Encounter (Signed)
pt called to move 2/3 appt to 3/11 due to she will not have copay 2/3.  former [facility] pt transferred to [facility]. due to [place] death in family and  retirement pt being moved to [facility] and will see Dr. Owens Loffler 10/14/13.

## 2013-09-08 ENCOUNTER — Other Ambulatory Visit: Payer: Self-pay

## 2013-09-08 ENCOUNTER — Encounter: Payer: Medicare Other | Admitting: Hematology and Oncology

## 2013-09-09 NOTE — Progress Notes (Signed)
I have not seen Renee [name] on 09/08/2013

## 2013-09-15 NOTE — Progress Notes (Signed)
This encounter was created in error - please disregard.

## 2013-10-03 ENCOUNTER — Encounter: Payer: Self-pay | Admitting: Oncology

## 2013-10-05 ENCOUNTER — Other Ambulatory Visit (HOSPITAL_COMMUNITY): Payer: Self-pay | Admitting: Hematology and Oncology

## 2013-10-05 DIAGNOSIS — Z9889 Other specified postprocedural states: Secondary | ICD-10-CM

## 2013-10-05 DIAGNOSIS — Z853 Personal history of malignant neoplasm of breast: Secondary | ICD-10-CM

## 2013-10-13 ENCOUNTER — Telehealth: Payer: Self-pay | Admitting: Hematology and Oncology

## 2013-10-13 ENCOUNTER — Other Ambulatory Visit: Payer: Self-pay | Admitting: Hematology and Oncology

## 2013-10-13 ENCOUNTER — Other Ambulatory Visit: Payer: Self-pay

## 2013-10-13 DIAGNOSIS — C50919 Malignant neoplasm of unspecified site of unspecified female breast: Secondary | ICD-10-CM

## 2013-10-13 NOTE — Telephone Encounter (Signed)
, °

## 2013-10-14 ENCOUNTER — Ambulatory Visit (HOSPITAL_BASED_OUTPATIENT_CLINIC_OR_DEPARTMENT_OTHER): Payer: Medicare Other | Admitting: Hematology and Oncology

## 2013-10-14 ENCOUNTER — Other Ambulatory Visit (HOSPITAL_BASED_OUTPATIENT_CLINIC_OR_DEPARTMENT_OTHER): Payer: Medicare Other

## 2013-10-14 ENCOUNTER — Encounter: Payer: Self-pay | Admitting: Hematology and Oncology

## 2013-10-14 VITALS — BP 158/83 | HR 76 | Temp 98.2°F | Resp 18 | Ht 59.0 in | Wt 198.2 lb

## 2013-10-14 DIAGNOSIS — C50419 Malignant neoplasm of upper-outer quadrant of unspecified female breast: Secondary | ICD-10-CM

## 2013-10-14 DIAGNOSIS — I10 Essential (primary) hypertension: Secondary | ICD-10-CM

## 2013-10-14 DIAGNOSIS — E559 Vitamin D deficiency, unspecified: Secondary | ICD-10-CM

## 2013-10-14 DIAGNOSIS — R5381 Other malaise: Secondary | ICD-10-CM

## 2013-10-14 DIAGNOSIS — R5383 Other fatigue: Secondary | ICD-10-CM

## 2013-10-14 DIAGNOSIS — M7989 Other specified soft tissue disorders: Secondary | ICD-10-CM

## 2013-10-14 DIAGNOSIS — C50919 Malignant neoplasm of unspecified site of unspecified female breast: Secondary | ICD-10-CM

## 2013-10-14 DIAGNOSIS — M25569 Pain in unspecified knee: Secondary | ICD-10-CM

## 2013-10-14 DIAGNOSIS — E119 Type 2 diabetes mellitus without complications: Secondary | ICD-10-CM

## 2013-10-14 LAB — CBC WITH DIFFERENTIAL/PLATELET
BASO%: 0.9 % (ref 0.0–2.0)
Basophils Absolute: 0.1 10*3/uL (ref 0.0–0.1)
EOS ABS: 0.2 10*3/uL (ref 0.0–0.5)
EOS%: 2.9 % (ref 0.0–7.0)
HCT: 35 % (ref 34.8–46.6)
HGB: 11.6 g/dL (ref 11.6–15.9)
LYMPH%: 21.9 % (ref 14.0–49.7)
MCH: 31.7 pg (ref 25.1–34.0)
MCHC: 33.2 g/dL (ref 31.5–36.0)
MCV: 95.6 fL (ref 79.5–101.0)
MONO#: 0.5 10*3/uL (ref 0.1–0.9)
MONO%: 8.2 % (ref 0.0–14.0)
NEUT%: 66.1 % (ref 38.4–76.8)
NEUTROS ABS: 3.9 10*3/uL (ref 1.5–6.5)
Platelets: 228 10*3/uL (ref 145–400)
RBC: 3.65 10*6/uL — AB (ref 3.70–5.45)
RDW: 13.8 % (ref 11.2–14.5)
WBC: 5.9 10*3/uL (ref 3.9–10.3)
lymph#: 1.3 10*3/uL (ref 0.9–3.3)

## 2013-10-14 LAB — COMPREHENSIVE METABOLIC PANEL (CC13)
ALBUMIN: 4 g/dL (ref 3.5–5.0)
ALK PHOS: 50 U/L (ref 40–150)
ALT: 13 U/L (ref 0–55)
AST: 17 U/L (ref 5–34)
Anion Gap: 11 mEq/L (ref 3–11)
BILIRUBIN TOTAL: 0.31 mg/dL (ref 0.20–1.20)
BUN: 24.3 mg/dL (ref 7.0–26.0)
CO2: 24 mEq/L (ref 22–29)
Calcium: 10 mg/dL (ref 8.4–10.4)
Chloride: 107 mEq/L (ref 98–109)
Creatinine: 1 mg/dL (ref 0.6–1.1)
GLUCOSE: 117 mg/dL (ref 70–140)
POTASSIUM: 4.5 meq/L (ref 3.5–5.1)
SODIUM: 141 meq/L (ref 136–145)
TOTAL PROTEIN: 7.2 g/dL (ref 6.4–8.3)

## 2013-10-14 NOTE — Progress Notes (Signed)
. Hematology and Oncology Follow Up Visit  Renee [name] [facility] [id number] [birth year] 73 y.o. 10/14/2013 1:33 PM   Principle Diagnosis: Encounter Diagnosis  Name Primary?  . Breast cancer Yes     Interim History:   I will be assuming this patient's care since Dr Truddie Coco left the practice.  This is a pleasant 73 year old postmenopausal woman who was found to have a suspicious mass in the left breast on a routine mammogram done 10/24/2010. She underwent a lumpectomy with sentinel lymph node dissection on 11/28/2010. 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 Estrogen receptor 96%, progesterone receptor 0,  Ki- 67 9%, HER-2 negative by CISH, Oncotype DX score 15. She received postop radiation and then was started on hormonal therapy with Arimidex which she continues at this time. She did develop a postop seroma and had to have a number of aspirations done. She developed a dense scar in the area of the lumpectomy. This seemed to be getting worse over time. She was  reevaluated by her general surgeon. Mammogram done on 09/09/2012 showed no areas of concern. She had repeat mammogram on 10/27/12 which was stable. Patient  reports significant right knee pain and swelling.She was told she might need knee joint replacement. She had 2 intraarticular injections without significant relief. She reports feeling very tired and appetite is decreased.Her weight is up by  8 pounds since last year. She denies cough,abdominal pain,bone pain or headache.She denies signs of bleeding.She had no recent Bone density.She is on Fosamax.She reports some teeth problems and due to see Dentist again.      Allergies:  Allergies  Allergen Reactions  . Sulfa Antibiotics     Obtain reaction from patient.  . Sulfonamide Derivatives     Obtain reaction from patient.    Review of Systems: Constitutional:   No  constitutional symptoms Respiratory: No cough or dyspnea Cardiovascular:  No chest pain or palpitations Gastrointestinal: No change in bowel habit. Colonoscopy done in 11/16/2011 small hyperplastic polyp removed Genito-Urinary: Remote history of recurrent urinary tract infection none recently. No vaginal bleeding. Neurologic: No headache or change in vision Skin: No rash or ecchymosis Remaining ROS negative.  Physical Exam: Blood pressure 158/83, pulse 76, temperature 98.2 F (36.8 C), temperature source Oral, resp. rate 18, height $RemoveBe'4\' 11"'MANWCJJKD$  (1.499 m), weight 198 lb 3.2 oz (89.903 kg). Wt Readings from Last 3 Encounters:  10/14/13 198 lb 3.2 oz (89.903 kg)  04/15/13 195 lb 6.4 oz (88.633 kg)  10/31/12 193 lb (87.544 kg)     General appearance: Well-nourished Caucasian woman HENNT: Pharynx no erythema exudate or mass Lymph nodes: No cervical, supraclavicular, or axillary adenopathy Breasts: Surgical changes left breast, linear nodular scar?mass at lumpectomy site, inverted nipple. Right breast normal no mass and nipple external Lungs: Clear to auscultation resonant to percussion Heart: Regular rhythm no murmur Abdomen: Soft, nontender, no mass, no organomegaly Extremities:  mild calf tenderness posteriorly behind knee due to marked edema around the right knee joint.Edema extends to the subqutaneous tissues.Less edema present  left knee joint. Vascular: No cyanosis Neurologic: She is alert and oriented  motor strength 5 over 5, reflexes 2+ symmetric, sensation intact to vibration over the fingertips Skin: No rash or ecchymosis Lab Results: Lab Results  Component Value Date   WBC 5.9 10/14/2013   HGB 11.6 10/14/2013   HCT 35.0 10/14/2013   MCV 95.6 10/14/2013   PLT 228 10/14/2013  Chemistry      Component Value Date/Time   NA 141 10/14/2013 0955   NA 139 09/03/2011 1316   K 4.5 10/14/2013 0955   K 4.1 09/03/2011 1316   CL 104 09/03/2011 1316   CO2 24 10/14/2013 0955   CO2 25  09/03/2011 1316   BUN 24.3 10/14/2013 0955   BUN 24* 09/03/2011 1316   CREATININE 1.0 10/14/2013 0955   CREATININE 0.89 09/03/2011 1316      Component Value Date/Time   CALCIUM 10.0 10/14/2013 0955   CALCIUM 9.4 09/03/2011 1316   ALKPHOS 50 10/14/2013 0955   ALKPHOS 48 09/03/2011 1316   AST 17 10/14/2013 0955   AST 22 09/03/2011 1316   ALT 13 10/14/2013 0955   ALT 13 09/03/2011 1316   BILITOT 0.31 10/14/2013 0955   BILITOT 0.3 09/03/2011 1316       Radiological Studies: Mm Digital Diagnostic Unilat L  09/09/2012  *RADIOLOGY REPORT*  Clinical Data:  Malignant lumpectomy of the left breast in April, 2012, with subsequent radiation therapy.  The patient presents today with left breast pain which the patient states is improved as on the weekends when she is not at work (where she uses her left arm extensively).  DIGITAL DIAGNOSTIC LEFT MAMMOGRAM WITH CAD  Comparison: 10/25/2011, 03/26/2011, dating back to 10/29/2006.  Findings:  ACR Breast Density Category 2: There is a scattered fibroglandular pattern.  CC and MLO views of the left breast were obtained.  Post lumpectomy scarring in the upper outer left breast.  Circumscribed mass at the lumpectomy site consistent with a seroma, with further interval decrease in size since the most recent examination from March, 2013.  Scattered benign appearing secretory type calcifications in the left breast.  No new suspicious mass, nonsurgical architectural distortion or suspicious calcifications in the left breast.  Mammographic images were processed with CAD.  IMPRESSION: No specific mammographic evidence of malignancy, left breast.  Post lumpectomy scarring in the upper outer left breast.  A seroma at the lumpectomy site which has shown further decrease in size since the most recent prior examination.  RECOMMENDATION: Bilateral diagnostic mammography which is due in late March, 2014.  We discussed the possibility of aspiration of the seroma.  However, the patient has  elected to wait and see if her pain becomes better after she stops working and is no longer using the left arm repetitively.  She was told to call the [facility] if she changes her mind and wishes to have this performed.  I have discussed the findings and recommendations with the patient. Results were also provided in writing at the conclusion of the visit.  BI-RADS CATEGORY 2:  Benign finding(s).   Original Report Authenticated By: Evangeline Dakin, M.D.     Impression and Plan: #1. 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 She remains stable without any obvious new disease now 3years from initial diagnosis. Will schedule Bone density and check vit D level.continue fosamax She is to continue on her current Vitamin that contains Calcium 1000 mg + Mg 500 mg+ vit D 1000 IU one tabl daily.To take in addition Vit D3 1000 IU one tabl daily.Also take in addition Calcium 600 mg 2 tabl per week.  Due to see Surgeon next week and due for mammograms now as well on 10/26/13.Will review mammogram  Continue Arimidex for a total of 5 years. I will see her again in one year.  Mammogram will need to schedule for 2016 after mammogram due now reviewed.  #2. Type 2 diabetes on oral agent  #3. Essential hypertension  #4. Degenerative arthritis  #5extreme fatigue and decreased appetite but weight is up.Patient concerned regarding options for right knee.   Will check Vit B12 level and TSH ,Mg level(muscle cramps),Vit D level and uric acid after we review her recent labs  done by her Primary MD Dr.Smith.   CC:. Dr. Carol Ada; Dr. Autumn Messing; Dr. Geoffry Paradise, MD Oncology/Hematology 3/11/20151:33 PM

## 2013-10-15 ENCOUNTER — Telehealth: Payer: Self-pay | Admitting: Oncology

## 2013-10-15 LAB — VITAMIN D 25 HYDROXY (VIT D DEFICIENCY, FRACTURES): VIT D 25 HYDROXY: 37 ng/mL (ref 30–89)

## 2013-10-15 NOTE — Telephone Encounter (Signed)
, °

## 2013-10-19 ENCOUNTER — Encounter (INDEPENDENT_AMBULATORY_CARE_PROVIDER_SITE_OTHER): Payer: Self-pay | Admitting: General Surgery

## 2013-10-19 ENCOUNTER — Other Ambulatory Visit (HOSPITAL_BASED_OUTPATIENT_CLINIC_OR_DEPARTMENT_OTHER): Payer: Medicare Other

## 2013-10-19 ENCOUNTER — Ambulatory Visit (INDEPENDENT_AMBULATORY_CARE_PROVIDER_SITE_OTHER): Payer: Medicare Other | Admitting: General Surgery

## 2013-10-19 ENCOUNTER — Other Ambulatory Visit: Payer: Self-pay | Admitting: Hematology and Oncology

## 2013-10-19 ENCOUNTER — Other Ambulatory Visit: Payer: Self-pay

## 2013-10-19 VITALS — BP 122/70 | HR 75 | Temp 97.9°F | Resp 16 | Ht 59.0 in | Wt 199.0 lb

## 2013-10-19 DIAGNOSIS — R5383 Other fatigue: Secondary | ICD-10-CM

## 2013-10-19 DIAGNOSIS — C50919 Malignant neoplasm of unspecified site of unspecified female breast: Secondary | ICD-10-CM

## 2013-10-19 DIAGNOSIS — C50419 Malignant neoplasm of upper-outer quadrant of unspecified female breast: Secondary | ICD-10-CM

## 2013-10-19 DIAGNOSIS — M25569 Pain in unspecified knee: Secondary | ICD-10-CM

## 2013-10-19 LAB — MAGNESIUM (CC13): MAGNESIUM: 1.9 mg/dL (ref 1.5–2.5)

## 2013-10-19 LAB — URIC ACID (CC13): URIC ACID, SERUM: 8.8 mg/dL — AB (ref 2.6–7.4)

## 2013-10-19 NOTE — Progress Notes (Signed)
Subjective:     Patient ID: Renee [name], female   DOB: [birth year], 73 y.o.   MRN: [id number]  HPI The patient is a 73 year old white female who is 3 years status post left breast lumpectomy and negative sentinel node biopsy for a T2 N0 left breast cancer. She continues to have some right knee pain and may need a right knee replacement. She also still has some burning sensation that comes and goes in the left breast. She continues to take anastrozole. She seems to be tolerating that well but she does have a lot of joint problems. She is scheduled for her next mammogram next week.  Review of Systems  Constitutional: Negative.   HENT: Negative.   Eyes: Negative.   Respiratory: Negative.   Cardiovascular: Negative.   Gastrointestinal: Negative.   Endocrine: Negative.   Genitourinary: Negative.   Musculoskeletal: Positive for arthralgias.  Skin: Negative.   Allergic/Immunologic: Negative.   Neurological: Negative.   Hematological: Negative.   Psychiatric/Behavioral: Negative.        Objective:   Physical Exam  Constitutional: She is oriented to person, place, and time. She appears well-developed and well-nourished.  HENT:  Head: Normocephalic and atraumatic.  Eyes: Conjunctivae and EOM are normal. Pupils are equal, round, and reactive to light.  Neck: Normal range of motion. Neck supple.  Cardiovascular: Normal rate, regular rhythm and normal heart sounds.   Pulmonary/Chest: Effort normal and breath sounds normal.  The palpable seroma in the upper outer left breast seems to continue to get smaller. There is no palpable mass in either breast. There is no palpable axillary, supraclavicular, or cervical lymphadenopathy  Abdominal: Soft. Bowel sounds are normal. She exhibits no mass. There is no tenderness.  Musculoskeletal: Normal range of motion.  Lymphadenopathy:    She has no cervical adenopathy.  Neurological: She is alert and oriented to person, place, and time.  Skin: Skin is  warm and dry.  Psychiatric: She has a normal mood and affect. Her behavior is normal.       Assessment:     The patient is 3 years status post left breast lumpectomy for breast cancer     Plan:     At this point she will continue to do regular self exams. She will continue to take anastrozole. I will plan to see her back in about 6 months. She will have her mammogram next week.

## 2013-10-19 NOTE — Patient Instructions (Signed)
Continue regular self exams Continue arimidex

## 2013-10-20 ENCOUNTER — Telehealth: Payer: Self-pay

## 2013-10-20 ENCOUNTER — Other Ambulatory Visit: Payer: Self-pay

## 2013-10-20 LAB — VITAMIN B12: Vitamin B-12: 358 pg/mL (ref 211–911)

## 2013-10-20 LAB — VITAMIN D 25 HYDROXY (VIT D DEFICIENCY, FRACTURES): Vit D, 25-Hydroxy: 36 ng/mL (ref 30–89)

## 2013-10-20 NOTE — Telephone Encounter (Signed)
Spoke with patient, informed her that her Uric Acid was elevated and Dr.Faidas would like her to follow up with her primary care provider. Also that she would like her to start taking Vitamin D 200 IU one daily OTC. Pt verbalized understanding. Denies any questions or concerns at this time. Informed patient to call back with any concerns or questions.

## 2013-10-21 ENCOUNTER — Other Ambulatory Visit: Payer: Self-pay

## 2013-10-21 NOTE — Telephone Encounter (Signed)
Patient called to see if we could fax her results to her PCP.  Labs routed to Dr. Carol Ada

## 2013-10-22 ENCOUNTER — Other Ambulatory Visit: Payer: Medicare Other

## 2013-10-26 ENCOUNTER — Other Ambulatory Visit: Payer: Self-pay | Admitting: Family Medicine

## 2013-10-26 ENCOUNTER — Other Ambulatory Visit: Payer: Self-pay | Admitting: Hematology and Oncology

## 2013-10-26 DIAGNOSIS — Z853 Personal history of malignant neoplasm of breast: Secondary | ICD-10-CM

## 2013-10-26 DIAGNOSIS — Z9889 Other specified postprocedural states: Secondary | ICD-10-CM

## 2013-10-29 ENCOUNTER — Ambulatory Visit
Admission: RE | Admit: 2013-10-29 | Discharge: 2013-10-29 | Disposition: A | Discharge status: Home / Self Care | Payer: Medicare Other | Source: Ambulatory Visit | Attending: Hematology and Oncology | Admitting: Hematology and Oncology

## 2013-10-29 DIAGNOSIS — C50919 Malignant neoplasm of unspecified site of unspecified female breast: Secondary | ICD-10-CM

## 2013-10-29 DIAGNOSIS — Z9889 Other specified postprocedural states: Secondary | ICD-10-CM

## 2013-10-29 DIAGNOSIS — Z853 Personal history of malignant neoplasm of breast: Secondary | ICD-10-CM

## 2013-11-10 NOTE — Progress Notes (Signed)
Report rcvd from [place] dtd 10/29/13.  Provided to Dana.

## 2013-12-03 ENCOUNTER — Telehealth: Payer: Self-pay

## 2013-12-03 NOTE — Telephone Encounter (Signed)
Let pt know bone density results normal.  Pt voiced understanding.   

## 2014-01-08 ENCOUNTER — Other Ambulatory Visit: Payer: Self-pay | Admitting: *Deleted

## 2014-01-08 ENCOUNTER — Telehealth: Payer: Self-pay | Admitting: Hematology and Oncology

## 2014-01-08 DIAGNOSIS — C50919 Malignant neoplasm of unspecified site of unspecified female breast: Secondary | ICD-10-CM

## 2014-01-08 MED ORDER — ANASTROZOLE 1 MG PO TABS: 1.0000 mg | ORAL_TABLET | Freq: Every day | ORAL | Status: DC

## 2014-01-08 NOTE — Telephone Encounter (Signed)
pt called today re refill . former PR pt to [place] to [facility]. pt was last seen by Dr. Owens Loffler and given lb/fu for March 2016 w/CP1. moved 10/18/14 appt to [facility] pt is aware. pt forwarded to triage re refill request.

## 2014-02-16 ENCOUNTER — Other Ambulatory Visit: Payer: Self-pay | Admitting: Orthopedic Surgery

## 2014-02-18 NOTE — H&P (Signed)
Renee [name] is an 73 y.o. female.   Chief Complaint: right knee pain  HPI:  Patient is here today for followup on right knee pain.  This patient was last seen on 01/14/14.  At that time is decided to have the patient have an MRI to rule out meniscal tear.  She is here today stating and that her knee pain has continued.  She is also here to review the results of the MRI.  He denies any new injury.  She denies any fevers chills night sweats or other signs of infection.  Past Medical History  Diagnosis Date  . Breast CA     lt breast ca dx 10/2010  . Hypertension   . Breast swelling   . Cellulitis   . Staph aureus infection   . Diabetes mellitus   . HTN (hypertension), benign 10/01/2012  . DM type 2 (diabetes mellitus, type 2) 10/01/2012    Past Surgical History  Procedure Laterality Date  . Back surgery    . Cholecystectomy    . Tubal ligation    . Breast surgery  11/28/10    Left lumpectomy+sln, Lobular,T2N0,ER+,Her2-,Ki9%  . Mole removal      Family History  Problem Relation Age of Onset  . Heart disease Mother   . Heart disease Brother    Social History:  reports that she has quit smoking. She does not have any smokeless tobacco history on file. She reports that she does not drink alcohol or use illicit drugs.  Allergies:  Allergies  Allergen Reactions  . Sulfa Antibiotics     Obtain reaction from patient.  . Sulfonamide Derivatives     Obtain reaction from patient.    No prescriptions prior to admission    No results found for this or any previous visit (from the past 48 hour(s)). No results found.  Review of Systems  Constitutional: Negative.   HENT: Negative.   Eyes: Positive for blurred vision.  Respiratory: Positive for cough.   Cardiovascular: Negative.   Gastrointestinal: Negative.   Genitourinary: Negative.   Musculoskeletal: Positive for joint pain.  Skin: Negative.   Neurological: Negative.   Endo/Heme/Allergies: Bruises/bleeds easily.   Psychiatric/Behavioral: Negative.     There were no vitals taken for this visit. Physical Exam  Constitutional: She is oriented to person, place, and time. She appears well-developed and well-nourished.  HENT:  Head: Normocephalic and atraumatic.  Eyes: Pupils are equal, round, and reactive to light.  Neck: Normal range of motion. Neck supple.  Cardiovascular: Intact distal pulses.   Respiratory: Effort normal.  Musculoskeletal:  She is obese.  Patient's skin is intact.  Her respirations are regular and nonlabored.  Patient has continued medial joint line tenderness.  No noticeable instability.  Her calves are soft and nontender.  She is neurovascularly intact distally.    Neurological: She is alert and oriented to person, place, and time.  Skin: Skin is warm and dry.  Psychiatric: She has a normal mood and affect. Her behavior is normal. Judgment and thought content normal.     Assessment/Plan Assess: Right knee pain with complex vertical tear of the medial meniscus  Plan: Options are discussed with the patient.  This patient is also discussed with Dr. Mayer Camel.  Patient has elected to proceed with knee arthroscopy.  Benefits risks and potential complications of surgery are discussed with the patient.  She wishes to proceed with scheduling.  A posting slip is completed and the patient is to discuss timing with Juliann Pulse  Blume.  She is to continue with her prescribe medications.  Return as needed.  Call with any issues.  PHILLIPS, ERIC R 02/18/2014, 9:16 PM

## 2014-02-19 ENCOUNTER — Encounter (HOSPITAL_BASED_OUTPATIENT_CLINIC_OR_DEPARTMENT_OTHER): Payer: Self-pay | Admitting: *Deleted

## 2014-02-19 NOTE — Progress Notes (Signed)
02/19/14 1114  OBSTRUCTIVE SLEEP APNEA  Have you ever been diagnosed with sleep apnea through a sleep study? No  Do you snore loudly (loud enough to be heard through closed doors)?  0  Do you often feel tired, fatigued, or sleepy during the daytime? 0  Has anyone observed you stop breathing during your sleep? 0  Do you have, or are you being treated for high blood pressure? 1  BMI more than 35 kg/m2? 1  Age over 73 years old? 1  Neck circumference greater than 40 cm/16 inches? 1  Obstructive Sleep Apnea Score 4  Score 4 or greater  Results sent to PCP

## 2014-02-19 NOTE — Progress Notes (Signed)
To go to [facility] for [id number] 

## 2014-02-22 ENCOUNTER — Other Ambulatory Visit: Payer: Self-pay

## 2014-02-22 ENCOUNTER — Encounter (HOSPITAL_COMMUNITY)
Admission: RE | Admit: 2014-02-22 | Discharge: 2014-02-22 | Disposition: A | Discharge status: Home / Self Care | Payer: Medicare Other | Source: Ambulatory Visit | Attending: Orthopedic Surgery | Admitting: Orthopedic Surgery

## 2014-02-22 DIAGNOSIS — L0291 Cutaneous abscess, unspecified: Secondary | ICD-10-CM | POA: Diagnosis not present

## 2014-02-22 DIAGNOSIS — Z882 Allergy status to sulfonamides status: Secondary | ICD-10-CM | POA: Diagnosis not present

## 2014-02-22 DIAGNOSIS — M25569 Pain in unspecified knee: Secondary | ICD-10-CM | POA: Diagnosis present

## 2014-02-22 DIAGNOSIS — I10 Essential (primary) hypertension: Secondary | ICD-10-CM | POA: Diagnosis not present

## 2014-02-22 DIAGNOSIS — E119 Type 2 diabetes mellitus without complications: Secondary | ICD-10-CM | POA: Diagnosis not present

## 2014-02-22 DIAGNOSIS — M2240 Chondromalacia patellae, unspecified knee: Secondary | ICD-10-CM | POA: Diagnosis not present

## 2014-02-22 DIAGNOSIS — Z853 Personal history of malignant neoplasm of breast: Secondary | ICD-10-CM | POA: Diagnosis not present

## 2014-02-22 DIAGNOSIS — M23329 Other meniscus derangements, posterior horn of medial meniscus, unspecified knee: Secondary | ICD-10-CM | POA: Diagnosis not present

## 2014-02-22 DIAGNOSIS — Z87891 Personal history of nicotine dependence: Secondary | ICD-10-CM | POA: Diagnosis not present

## 2014-02-22 DIAGNOSIS — Z01812 Encounter for preprocedural laboratory examination: Secondary | ICD-10-CM | POA: Diagnosis not present

## 2014-02-22 DIAGNOSIS — E669 Obesity, unspecified: Secondary | ICD-10-CM | POA: Diagnosis not present

## 2014-02-22 DIAGNOSIS — M23359 Other meniscus derangements, posterior horn of lateral meniscus, unspecified knee: Secondary | ICD-10-CM | POA: Diagnosis not present

## 2014-02-22 LAB — BASIC METABOLIC PANEL
ANION GAP: 14 (ref 5–15)
BUN: 26 mg/dL — ABNORMAL HIGH (ref 6–23)
CALCIUM: 9.5 mg/dL (ref 8.4–10.5)
CHLORIDE: 102 meq/L (ref 96–112)
CO2: 24 meq/L (ref 19–32)
Creatinine, Ser: 0.99 mg/dL (ref 0.50–1.10)
GFR calc Af Amer: 64 mL/min — ABNORMAL LOW (ref >90)
GFR calc non Af Amer: 56 mL/min — ABNORMAL LOW (ref >90)
GLUCOSE: 132 mg/dL — AB (ref 70–99)
POTASSIUM: 4.4 meq/L (ref 3.7–5.3)
Sodium: 140 mEq/L (ref 137–147)

## 2014-02-24 ENCOUNTER — Ambulatory Visit (HOSPITAL_BASED_OUTPATIENT_CLINIC_OR_DEPARTMENT_OTHER)
Admission: RE | Admit: 2014-02-24 | Discharge: 2014-02-24 | Disposition: A | Discharge status: Home / Self Care | Payer: Medicare Other | Source: Ambulatory Visit | Attending: Orthopedic Surgery | Admitting: Orthopedic Surgery

## 2014-02-24 ENCOUNTER — Encounter (HOSPITAL_BASED_OUTPATIENT_CLINIC_OR_DEPARTMENT_OTHER): Payer: Self-pay | Admitting: Anesthesiology

## 2014-02-24 ENCOUNTER — Ambulatory Visit (HOSPITAL_BASED_OUTPATIENT_CLINIC_OR_DEPARTMENT_OTHER): Payer: Medicare Other | Admitting: Anesthesiology

## 2014-02-24 ENCOUNTER — Encounter (HOSPITAL_BASED_OUTPATIENT_CLINIC_OR_DEPARTMENT_OTHER)
Admission: RE | Disposition: A | Discharge status: Home / Self Care | Payer: Self-pay | Source: Ambulatory Visit | Attending: Orthopedic Surgery

## 2014-02-24 ENCOUNTER — Encounter (HOSPITAL_BASED_OUTPATIENT_CLINIC_OR_DEPARTMENT_OTHER): Payer: Medicare Other | Admitting: Anesthesiology

## 2014-02-24 DIAGNOSIS — M23321 Other meniscus derangements, posterior horn of medial meniscus, right knee: Secondary | ICD-10-CM

## 2014-02-24 DIAGNOSIS — E669 Obesity, unspecified: Secondary | ICD-10-CM | POA: Insufficient documentation

## 2014-02-24 DIAGNOSIS — I10 Essential (primary) hypertension: Secondary | ICD-10-CM | POA: Insufficient documentation

## 2014-02-24 DIAGNOSIS — M2240 Chondromalacia patellae, unspecified knee: Secondary | ICD-10-CM | POA: Insufficient documentation

## 2014-02-24 DIAGNOSIS — L0390 Cellulitis, unspecified: Secondary | ICD-10-CM

## 2014-02-24 DIAGNOSIS — M23329 Other meniscus derangements, posterior horn of medial meniscus, unspecified knee: Secondary | ICD-10-CM | POA: Diagnosis not present

## 2014-02-24 DIAGNOSIS — E119 Type 2 diabetes mellitus without complications: Secondary | ICD-10-CM | POA: Insufficient documentation

## 2014-02-24 DIAGNOSIS — Z853 Personal history of malignant neoplasm of breast: Secondary | ICD-10-CM | POA: Insufficient documentation

## 2014-02-24 DIAGNOSIS — Z882 Allergy status to sulfonamides status: Secondary | ICD-10-CM | POA: Insufficient documentation

## 2014-02-24 DIAGNOSIS — L0291 Cutaneous abscess, unspecified: Secondary | ICD-10-CM | POA: Insufficient documentation

## 2014-02-24 DIAGNOSIS — Z87891 Personal history of nicotine dependence: Secondary | ICD-10-CM | POA: Insufficient documentation

## 2014-02-24 DIAGNOSIS — Z01812 Encounter for preprocedural laboratory examination: Secondary | ICD-10-CM | POA: Insufficient documentation

## 2014-02-24 DIAGNOSIS — M23359 Other meniscus derangements, posterior horn of lateral meniscus, unspecified knee: Secondary | ICD-10-CM | POA: Insufficient documentation

## 2014-02-24 HISTORY — PX: KNEE ARTHROSCOPY: SHX127

## 2014-02-24 HISTORY — DX: Presence of spectacles and contact lenses: Z97.3

## 2014-02-24 LAB — POCT HEMOGLOBIN-HEMACUE: Hemoglobin: 10.9 g/dL — ABNORMAL LOW (ref 12.0–15.0)

## 2014-02-24 LAB — GLUCOSE, CAPILLARY
Glucose-Capillary: 113 mg/dL — ABNORMAL HIGH (ref 70–99)
Glucose-Capillary: 113 mg/dL — ABNORMAL HIGH (ref 70–99)

## 2014-02-24 SURGERY — ARTHROSCOPY, KNEE
Anesthesia: General | Site: Knee | Laterality: Right

## 2014-02-24 MED ORDER — EPHEDRINE SULFATE 50 MG/ML IJ SOLN
INTRAMUSCULAR | Status: DC | PRN
  Administered 2014-02-24: 10 mg via INTRAVENOUS

## 2014-02-24 MED ORDER — PROPOFOL 10 MG/ML IV BOLUS
INTRAVENOUS | Status: AC
  Filled 2014-02-24: qty 20

## 2014-02-24 MED ORDER — FENTANYL CITRATE 0.05 MG/ML IJ SOLN
INTRAMUSCULAR | Status: DC | PRN
  Administered 2014-02-24 (×2): 50 ug via INTRAVENOUS

## 2014-02-24 MED ORDER — LIDOCAINE HCL (CARDIAC) 20 MG/ML IV SOLN
INTRAVENOUS | Status: DC | PRN
  Administered 2014-02-24: 50 mg via INTRAVENOUS

## 2014-02-24 MED ORDER — PROPOFOL 10 MG/ML IV BOLUS
INTRAVENOUS | Status: DC | PRN
  Administered 2014-02-24: 200 mg via INTRAVENOUS
  Administered 2014-02-24: 50 mg via INTRAVENOUS

## 2014-02-24 MED ORDER — DEXTROSE-NACL 5-0.45 % IV SOLN: INTRAVENOUS | Status: DC

## 2014-02-24 MED ORDER — HYDROMORPHONE HCL PF 1 MG/ML IJ SOLN: 0.2500 mg | INTRAMUSCULAR | Status: DC | PRN

## 2014-02-24 MED ORDER — FENTANYL CITRATE 0.05 MG/ML IJ SOLN: 50.0000 ug | INTRAMUSCULAR | Status: DC | PRN

## 2014-02-24 MED ORDER — CEFAZOLIN SODIUM-DEXTROSE 2-3 GM-% IV SOLR
INTRAVENOUS | Status: AC
  Filled 2014-02-24: qty 50

## 2014-02-24 MED ORDER — LACTATED RINGERS IV SOLN
INTRAVENOUS | Status: DC
Start: 2014-02-24 — End: 2014-02-24
  Administered 2014-02-24: 11:00:00 via INTRAVENOUS

## 2014-02-24 MED ORDER — BUPIVACAINE-EPINEPHRINE 0.5% -1:200000 IJ SOLN
INTRAMUSCULAR | Status: DC | PRN
  Administered 2014-02-24: 20 mL

## 2014-02-24 MED ORDER — ONDANSETRON HCL 4 MG/2ML IJ SOLN
INTRAMUSCULAR | Status: DC | PRN
  Administered 2014-02-24: 4 mg via INTRAVENOUS

## 2014-02-24 MED ORDER — HYDROCODONE-ACETAMINOPHEN 5-325 MG PO TABS: 1.0000 | ORAL_TABLET | Freq: Four times a day (QID) | ORAL | Status: DC | PRN

## 2014-02-24 MED ORDER — FENTANYL CITRATE 0.05 MG/ML IJ SOLN
INTRAMUSCULAR | Status: AC
  Filled 2014-02-24: qty 4

## 2014-02-24 MED ORDER — OXYCODONE HCL 5 MG PO TABS: 5.0000 mg | ORAL_TABLET | Freq: Once | ORAL | Status: DC | PRN

## 2014-02-24 MED ORDER — OXYCODONE HCL 5 MG/5ML PO SOLN: 5.0000 mg | Freq: Once | ORAL | Status: DC | PRN

## 2014-02-24 MED ORDER — EPINEPHRINE HCL 1 MG/ML IJ SOLN
INTRAMUSCULAR | Status: AC
Start: 2014-02-24 — End: 2014-02-24
  Filled 2014-02-24: qty 1

## 2014-02-24 MED ORDER — METOCLOPRAMIDE HCL 5 MG/ML IJ SOLN: 10.0000 mg | Freq: Once | INTRAMUSCULAR | Status: DC | PRN

## 2014-02-24 MED ORDER — BUPIVACAINE-EPINEPHRINE (PF) 0.5% -1:200000 IJ SOLN
INTRAMUSCULAR | Status: AC
  Filled 2014-02-24: qty 30

## 2014-02-24 MED ORDER — MIDAZOLAM HCL 2 MG/2ML IJ SOLN: 1.0000 mg | INTRAMUSCULAR | Status: DC | PRN

## 2014-02-24 MED ORDER — CEFAZOLIN SODIUM-DEXTROSE 2-3 GM-% IV SOLR
2.0000 g | INTRAVENOUS | Status: AC
  Administered 2014-02-24: 2 g via INTRAVENOUS

## 2014-02-24 MED ORDER — SODIUM CHLORIDE 0.9 % IR SOLN
Status: DC | PRN
  Administered 2014-02-24: 2000 mL

## 2014-02-24 MED ORDER — METOCLOPRAMIDE HCL 5 MG/ML IJ SOLN
INTRAMUSCULAR | Status: DC | PRN
  Administered 2014-02-24: 10 mg via INTRAVENOUS

## 2014-02-24 MED ORDER — CHLORHEXIDINE GLUCONATE 4 % EX LIQD: 60.0000 mL | Freq: Once | CUTANEOUS | Status: DC

## 2014-02-24 SURGICAL SUPPLY — 41 items
BANDAGE ELASTIC 6 VELCRO ST LF (GAUZE/BANDAGES/DRESSINGS) ×3 IMPLANT
BLADE 4.2CUDA (BLADE) IMPLANT
BLADE CUTTER GATOR 3.5 (BLADE) ×3 IMPLANT
BLADE GREAT WHITE 4.2 (BLADE) ×2 IMPLANT
BLADE GREAT WHITE 4.2MM (BLADE) ×1
CANISTER SUCT 3000ML (MISCELLANEOUS) IMPLANT
DRAPE ARTHROSCOPY W/POUCH 114 (DRAPES) ×3 IMPLANT
DURAPREP 26ML APPLICATOR (WOUND CARE) ×3 IMPLANT
ELECT MENISCUS 165MM 90D (ELECTRODE) IMPLANT
ELECT REM PT RETURN 9FT ADLT (ELECTROSURGICAL)
ELECTRODE REM PT RTRN 9FT ADLT (ELECTROSURGICAL) IMPLANT
GAUZE SPONGE 4X4 12PLY STRL (GAUZE/BANDAGES/DRESSINGS) ×3 IMPLANT
GAUZE XEROFORM 1X8 LF (GAUZE/BANDAGES/DRESSINGS) ×3 IMPLANT
GLOVE BIO SURGEON STRL SZ7.5 (GLOVE) ×3 IMPLANT
GLOVE BIO SURGEON STRL SZ8.5 (GLOVE) IMPLANT
GLOVE BIOGEL PI IND STRL 8 (GLOVE) ×1 IMPLANT
GLOVE BIOGEL PI IND STRL 9 (GLOVE) IMPLANT
GLOVE BIOGEL PI INDICATOR 8 (GLOVE) ×2
GLOVE BIOGEL PI INDICATOR 9 (GLOVE)
GLOVE ECLIPSE 6.5 STRL STRAW (GLOVE) ×3 IMPLANT
GOWN STRL REUS W/ TWL LRG LVL3 (GOWN DISPOSABLE) ×1 IMPLANT
GOWN STRL REUS W/ TWL XL LVL3 (GOWN DISPOSABLE) ×1 IMPLANT
GOWN STRL REUS W/TWL LRG LVL3 (GOWN DISPOSABLE) ×3
GOWN STRL REUS W/TWL XL LVL3 (GOWN DISPOSABLE) ×3
IV NS IRRIG 3000ML ARTHROMATIC (IV SOLUTION) ×3 IMPLANT
KNEE WRAP E Z 3 GEL PACK (MISCELLANEOUS) ×3 IMPLANT
MANIFOLD NEPTUNE II (INSTRUMENTS) IMPLANT
NDL SAFETY ECLIPSE 18X1.5 (NEEDLE) IMPLANT
NEEDLE HYPO 18GX1.5 SHARP (NEEDLE)
PACK ARTHROSCOPY DSU (CUSTOM PROCEDURE TRAY) ×3 IMPLANT
PACK BASIN DAY SURGERY FS (CUSTOM PROCEDURE TRAY) ×3 IMPLANT
PAD ALCOHOL SWAB (MISCELLANEOUS) IMPLANT
PENCIL BUTTON HOLSTER BLD 10FT (ELECTRODE) IMPLANT
SET ARTHROSCOPY TUBING (MISCELLANEOUS) ×2
SET ARTHROSCOPY TUBING LN (MISCELLANEOUS) ×1 IMPLANT
SLEEVE SCD COMPRESS KNEE MED (MISCELLANEOUS) IMPLANT
SYRINGE 3CC/18X1.5 ECLIPSE (MISCELLANEOUS) IMPLANT
SYRINGE 6CC (MISCELLANEOUS) ×3 IMPLANT
TOWEL OR 17X24 6PK STRL BLUE (TOWEL DISPOSABLE) ×3 IMPLANT
WAND STAR VAC 90 (SURGICAL WAND) IMPLANT
WATER STERILE IRR 1000ML POUR (IV SOLUTION) ×3 IMPLANT

## 2014-02-24 NOTE — Discharge Instructions (Signed)

## 2014-02-24 NOTE — Interval H&P Note (Signed)
History and Physical Interval Note:  02/24/2014 11:30 AM  Renee [name]  has presented today for surgery, with the diagnosis of right knee medial meniscus tear  The various methods of treatment have been discussed with the patient and family. After consideration of risks, benefits and other options for treatment, the patient has consented to  Procedure(s): ARTHROSCOPY RIGHT KNEE (Right) as a surgical intervention .  The patient's history has been reviewed, patient examined, no change in status, stable for surgery.  I have reviewed the patient's chart and labs.  Questions were answered to the patient's satisfaction.     Kerin Salen

## 2014-02-24 NOTE — Anesthesia Preprocedure Evaluation (Signed)
Anesthesia Evaluation  Patient identified by MRN, date of birth, ID band Patient awake    Reviewed: Allergy & Precautions, H&P , NPO status , Patient's Chart, lab work & pertinent test results, reviewed documented beta blocker date and time   Airway Mallampati: II TM Distance: >3 FB Neck ROM: full    Dental   Pulmonary neg pulmonary ROS, former smoker,  breath sounds clear to auscultation        Cardiovascular hypertension, On Medications and On Home Beta Blockers Rhythm:regular     Neuro/Psych negative neurological ROS  negative psych ROS   GI/Hepatic negative GI ROS, Neg liver ROS,   Endo/Other  diabetes, Oral Hypoglycemic AgentsMorbid obesity  Renal/GU negative Renal ROS  negative genitourinary   Musculoskeletal   Abdominal   Peds  Hematology negative hematology ROS (+)   Anesthesia Other Findings See surgeon's H&P   Reproductive/Obstetrics negative OB ROS                           Anesthesia Physical Anesthesia Plan  ASA: III  Anesthesia Plan: General   Post-op Pain Management:    Induction: Intravenous  Airway Management Planned: LMA  Additional Equipment:   Intra-op Plan:   Post-operative Plan:   Informed Consent: I have reviewed the patients History and Physical, chart, labs and discussed the procedure including the risks, benefits and alternatives for the proposed anesthesia with the patient or authorized representative who has indicated his/her understanding and acceptance.   Dental Advisory Given  Plan Discussed with: CRNA and Surgeon  Anesthesia Plan Comments:         Anesthesia Quick Evaluation

## 2014-02-24 NOTE — Anesthesia Postprocedure Evaluation (Signed)
Anesthesia Post Note  Patient: Renee [name]  Procedure(s) Performed: Procedure(s) (LRB): ARTHROSCOPY RIGHT KNEE partial medial and lateral menisectomy chondroplasty (Right)  Anesthesia type: General  Patient location: PACU  Post pain: Pain level controlled  Post assessment: Patient's Cardiovascular Status Stable  Last Vitals:  Filed Vitals:   02/24/14 1324  BP: 126/99  Pulse: 74  Temp: 36.7 C  Resp: 18    Post vital signs: Reviewed and stable  Level of consciousness: alert  Complications: No apparent anesthesia complications

## 2014-02-24 NOTE — Op Note (Signed)
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 The medial meniscus tear complex, correlation medial femoral condyle grade 3 with flap tears, posterior horn lateral meniscus tear, parrot-beak   Procedure: Right knee arthroscopic partial medial and lateral meniscectomies debridement, malacia grade 3 from medial femoral condyle  Surgeon: Kathalene Frames. Mayer Camel M.D.  Assist:  Anes: General LMA  EBL: Minimal  Fluids: 800 cc   Indications: Patient has catching popping and pain in her right knee, MRI scan shows a large complex posterior horn medial meniscal tear.. Pt has failed conservative treatment with anti-inflammatory medicines, physical therapy, and modified activites but did get good temporarily from an intra-articular cortisone injection. Pain has recurred and patient desires elective arthroscopic evaluation and treatment of knee. Risks and benefits of surgery have been discussed and questions answered.  Procedure: Patient identified by arm band and taken to the operating room at the day surgery Center. The appropriate anesthetic monitors were attached, and General LMA anesthesia was induced without difficulty. Lateral post was applied to the table and the lower extremity was prepped and draped in usual sterile fashion from the ankle to the midthigh. Time out procedure was performed. We began the operation by making standard inferior lateral and inferior medial peripatellar portals with a #11 blade allowing introduction of the arthroscope through the inferior lateral portal and the out flow to the inferior medial portal. Pump pressure was set at 100 mmHg and diagnostic arthroscopy  revealed grade 1-2 chondromalacia the patellofemoral joint lightly debrided, multiple small loose bodies in the joint fluid merrily cartilage that were incidentally removed during the procedure. Moving into the medial compartment she had a large complex parrot-beak tear of the medial meniscus  posterior to medial horns. This is debrided back to a stable margin with the 4.2 gray-white sucker shaver and a 35 Gator sucker shaver. Grade 3 chondromalacia of the medial femoral condyle is identified distally and posteriorly and debrided with the Gator sucker shaver. The anterior cruciate ligament and PCL are intact on the lateral side the articular cartilage was in excellent condition the posterior horn the lateral meniscus had a parrot-beak tear near the root which was debrided with a 4.2 gray-white sucker shaver.. The knee was irrigated out normal saline solution. A dressing of xerofoam 4 x 4 dressing sponges, web roll and an Ace wrap was applied. The patient was awakened extubated and taken to the recovery without difficulty.    Signed: Kerin Salen, MD

## 2014-02-24 NOTE — Anesthesia Procedure Notes (Signed)
Procedure Name: LMA Insertion Date/Time: 02/24/2014 11:53 AM Performed by: Toula Moos L Pre-anesthesia Checklist: Patient identified, Emergency Drugs available, Suction available, Patient being monitored and Timeout performed Patient Re-evaluated:Patient Re-evaluated prior to inductionOxygen Delivery Method: Circle System Utilized Preoxygenation: Pre-oxygenation with 100% oxygen Intubation Type: IV induction Ventilation: Mask ventilation without difficulty LMA: LMA inserted LMA Size: 4.0 Number of attempts: 1 Airway Equipment and Method: bite block Placement Confirmation: positive ETCO2 and breath sounds checked- equal and bilateral Tube secured with: Tape Dental Injury: Teeth and Oropharynx as per pre-operative assessment  Comments: Front upper incisors undisturbed and intact as pre-op after lubricated LMA carefully and gently inserted. Jacklynn Barnacle CRNA

## 2014-02-24 NOTE — Transfer of Care (Signed)
Immediate Anesthesia Transfer of Care Note  Patient: Renee [name]  Procedure(s) Performed: Procedure(s): ARTHROSCOPY RIGHT KNEE partial medial and lateral menisectomy chondroplasty (Right)  Patient Location: PACU  Anesthesia Type:General  Level of Consciousness: awake, sedated and patient cooperative  Airway & Oxygen Therapy: Patient Spontanous Breathing and Patient connected to face mask oxygen  Post-op Assessment: Report given to PACU RN and Post -op Vital signs reviewed and stable  Post vital signs: Reviewed and stable  Complications: No apparent anesthesia complications

## 2014-02-25 ENCOUNTER — Encounter (HOSPITAL_BASED_OUTPATIENT_CLINIC_OR_DEPARTMENT_OTHER): Payer: Self-pay | Admitting: Orthopedic Surgery

## 2014-04-22 ENCOUNTER — Ambulatory Visit (INDEPENDENT_AMBULATORY_CARE_PROVIDER_SITE_OTHER): Payer: Medicare Other | Admitting: General Surgery

## 2014-10-04 ENCOUNTER — Other Ambulatory Visit: Payer: Self-pay | Admitting: General Surgery

## 2014-10-04 DIAGNOSIS — Z853 Personal history of malignant neoplasm of breast: Secondary | ICD-10-CM

## 2014-10-15 ENCOUNTER — Other Ambulatory Visit: Payer: Self-pay

## 2014-10-15 DIAGNOSIS — C50919 Malignant neoplasm of unspecified site of unspecified female breast: Secondary | ICD-10-CM

## 2014-10-18 ENCOUNTER — Telehealth: Payer: Self-pay | Admitting: Hematology and Oncology

## 2014-10-18 ENCOUNTER — Other Ambulatory Visit (HOSPITAL_BASED_OUTPATIENT_CLINIC_OR_DEPARTMENT_OTHER): Payer: Commercial Managed Care - HMO

## 2014-10-18 ENCOUNTER — Ambulatory Visit (HOSPITAL_BASED_OUTPATIENT_CLINIC_OR_DEPARTMENT_OTHER): Payer: Commercial Managed Care - HMO | Admitting: Hematology and Oncology

## 2014-10-18 VITALS — BP 150/67 | HR 67 | Temp 98.0°F | Resp 19 | Ht 59.0 in | Wt 198.4 lb

## 2014-10-18 DIAGNOSIS — D509 Iron deficiency anemia, unspecified: Secondary | ICD-10-CM

## 2014-10-18 DIAGNOSIS — Z79810 Long term (current) use of selective estrogen receptor modulators (SERMs): Secondary | ICD-10-CM

## 2014-10-18 DIAGNOSIS — C50912 Malignant neoplasm of unspecified site of left female breast: Secondary | ICD-10-CM

## 2014-10-18 DIAGNOSIS — C50919 Malignant neoplasm of unspecified site of unspecified female breast: Secondary | ICD-10-CM

## 2014-10-18 DIAGNOSIS — Z853 Personal history of malignant neoplasm of breast: Secondary | ICD-10-CM

## 2014-10-18 LAB — CBC WITH DIFFERENTIAL/PLATELET
BASO%: 0.8 % (ref 0.0–2.0)
Basophils Absolute: 0 10*3/uL (ref 0.0–0.1)
EOS%: 4.1 % (ref 0.0–7.0)
Eosinophils Absolute: 0.2 10*3/uL (ref 0.0–0.5)
HCT: 33.7 % — ABNORMAL LOW (ref 34.8–46.6)
HEMOGLOBIN: 10.9 g/dL — AB (ref 11.6–15.9)
LYMPH#: 1.2 10*3/uL (ref 0.9–3.3)
LYMPH%: 25.5 % (ref 14.0–49.7)
MCH: 31.1 pg (ref 25.1–34.0)
MCHC: 32.2 g/dL (ref 31.5–36.0)
MCV: 96.4 fL (ref 79.5–101.0)
MONO#: 0.4 10*3/uL (ref 0.1–0.9)
MONO%: 9.3 % (ref 0.0–14.0)
NEUT%: 60.3 % (ref 38.4–76.8)
NEUTROS ABS: 2.7 10*3/uL (ref 1.5–6.5)
PLATELETS: 222 10*3/uL (ref 145–400)
RBC: 3.5 10*6/uL — ABNORMAL LOW (ref 3.70–5.45)
RDW: 13.8 % (ref 11.2–14.5)
WBC: 4.5 10*3/uL (ref 3.9–10.3)

## 2014-10-18 LAB — COMPREHENSIVE METABOLIC PANEL (CC13)
ALK PHOS: 45 U/L (ref 40–150)
ALT: 13 U/L (ref 0–55)
AST: 18 U/L (ref 5–34)
Albumin: 3.8 g/dL (ref 3.5–5.0)
Anion Gap: 10 mEq/L (ref 3–11)
BUN: 29.7 mg/dL — ABNORMAL HIGH (ref 7.0–26.0)
CHLORIDE: 103 meq/L (ref 98–109)
CO2: 25 meq/L (ref 22–29)
CREATININE: 1 mg/dL (ref 0.6–1.1)
Calcium: 9.5 mg/dL (ref 8.4–10.4)
EGFR: 53 mL/min/{1.73_m2} — ABNORMAL LOW (ref >90)
GLUCOSE: 147 mg/dL — AB (ref 70–140)
Potassium: 4.2 mEq/L (ref 3.5–5.1)
Sodium: 138 mEq/L (ref 136–145)
TOTAL PROTEIN: 7.1 g/dL (ref 6.4–8.3)
Total Bilirubin: 0.37 mg/dL (ref 0.20–1.20)

## 2014-10-18 NOTE — Progress Notes (Signed)
Patient Care Team: Carol Ada, MD as PCP - General (Family Medicine)  DIAGNOSIS: No matching staging information was found for the patient.  SUMMARY OF ONCOLOGIC HISTORY: 1. seeleft breast on a routine mammogram done 10/24/2010.. She underwent a lumpectomy with sentinel lymph node dissection on 11/28/2010. 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 Estrogen receptor 96%, progesterone receptor 0, Ki- 67 9%, HER-2 negative by CISH, Oncotype DX score 15.  2. She received postop radiation and then was started on hormonal therapy with Arimidex which she continues at this time.  CHIEF COMPLIANT: follow-up of breast cancer  INTERVAL HISTORY: Renee [name] is a 74 year old lady with above-mentioned history of breast cancer diagnosed in 2012 it was invasive lobular cancer that was treated with lumpectomy radiation and is currently on oral Arimidex therapy. She is tolerating it fairly well without any major problems. She denies any hot flashes. She has a lot of arthritis symptoms especially the right hip.  REVIEW OF SYSTEMS:   Constitutional: Denies fevers, chills or abnormal weight loss Eyes: Denies blurriness of vision Ears, nose, mouth, throat, and face: Denies mucositis or sore throat Respiratory: Denies cough, dyspnea or wheezes Cardiovascular: Denies palpitation, chest discomfort or lower extremity swelling Gastrointestinal:  Denies nausea, heartburn or change in bowel habits Skin: Denies abnormal skin rashes Lymphatics: Denies new lymphadenopathy or easy bruising Neurological:Denies numbness, tingling or new weaknesses Behavioral/Psych: Mood is stable, no new changes  Breast:  denies any pain or lumps or nodules in either breasts All other systems were reviewed with the patient and are negative.  I have reviewed the past medical history, past surgical history, social  history and family history with the patient and they are unchanged from previous note.  ALLERGIES:  is allergic to sulfonamide derivatives and sulfa antibiotics.  MEDICATIONS:  Current Outpatient Prescriptions  Medication Sig Dispense Refill  . alendronate (FOSAMAX) 35 MG tablet Take 70 mg by mouth every 7 (seven) days. Take with a full glass of water on an empty stomach.    Marland Kitchen anastrozole (ARIMIDEX) 1 MG tablet Take 1 tablet (1 mg total) by mouth daily. 90 tablet 3  . atorvastatin (LIPITOR) 10 MG tablet Take 5 mg by mouth daily.     Marland Kitchen FREESTYLE LITE test strip     . Lancets (FREESTYLE) lancets     . metoprolol (TOPROL-XL) 100 MG 24 hr tablet Take 100 mg by mouth daily.      . pioglitazone-metformin (ACTOPLUS MET) 15-500 MG per tablet Take 1 tablet by mouth 2 (two) times daily with a meal.      . valsartan-hydrochlorothiazide (DIOVAN-HCT) 160-25 MG per tablet Take 1 tablet by mouth daily.      . calcium-vitamin D 250-100 MG-UNIT per tablet Take 1 tablet by mouth daily. Not sure of dose    . HYDROcodone-acetaminophen (NORCO) 5-325 MG per tablet Take 1 tablet by mouth every 6 (six) hours as needed for moderate pain. (Patient not taking: Reported on 10/18/2014) 60 tablet 0  . Multiple Vitamin (MULTIVITAMIN) tablet Take 1 tablet by mouth daily.    . naproxen (NAPROSYN) 375 MG tablet      No current facility-administered medications for this visit.    PHYSICAL EXAMINATION: ECOG PERFORMANCE STATUS: 1 - Symptomatic but completely ambulatory  Filed Vitals:   10/18/14 1045  BP: 150/67  Pulse: 67  Temp: 98 F (36.7 C)  Resp: 19   Filed Weights  10/18/14 1045  Weight: 198 lb 6.4 oz (89.994 kg)    GENERAL:alert, no distress and comfortable SKIN: skin color, texture, turgor are normal, no rashes or significant lesions EYES: normal, Conjunctiva are pink and non-injected, sclera clear OROPHARYNX:no exudate, no erythema and lips, buccal mucosa, and tongue normal  NECK: supple, thyroid  normal size, non-tender, without nodularity LYMPH:  no palpable lymphadenopathy in the cervical, axillary or inguinal LUNGS: clear to auscultation and percussion with normal breathing effort HEART: regular rate & rhythm and no murmurs and no lower extremity edema ABDOMEN:abdomen soft, non-tender and normal bowel sounds Musculoskeletal:no cyanosis of digits and no clubbing  NEURO: alert & oriented x 3 with fluent speech, no focal motor/sensory deficits BREAST: No palpable masses or nodules in either right or left breasts. No palpable axillary supraclavicular or infraclavicular adenopathy no breast tenderness or nipple discharge. (exam performed in the presence of a chaperone)  LABORATORY DATA:  I have reviewed the data as listed   Chemistry      Component Value Date/Time   NA 138 10/18/2014 1036   NA 140 02/22/2014 1150   K 4.2 10/18/2014 1036   K 4.4 02/22/2014 1150   CL 102 02/22/2014 1150   CO2 25 10/18/2014 1036   CO2 24 02/22/2014 1150   BUN 29.7* 10/18/2014 1036   BUN 26* 02/22/2014 1150   CREATININE 1.0 10/18/2014 1036   CREATININE 0.99 02/22/2014 1150      Component Value Date/Time   CALCIUM 9.5 10/18/2014 1036   CALCIUM 9.5 02/22/2014 1150   ALKPHOS 45 10/18/2014 1036   ALKPHOS 48 09/03/2011 1316   AST 18 10/18/2014 1036   AST 22 09/03/2011 1316   ALT 13 10/18/2014 1036   ALT 13 09/03/2011 1316   BILITOT 0.37 10/18/2014 1036   BILITOT 0.3 09/03/2011 1316       Lab Results  Component Value Date   WBC 4.5 10/18/2014   HGB 10.9* 10/18/2014   HCT 33.7* 10/18/2014   MCV 96.4 10/18/2014   PLT 222 10/18/2014   NEUTROABS 2.7 10/18/2014    ASSESSMENT & PLAN:  Breast cancer, left breast Left breast cancer diagnosed 10/24/2010 status post lumpectomy 11/28/2010, 2.2 cm invasive lobular cancer, grade 1, no lymphovascular invasion, T2 N0 M0 stage II A, ER 96%, PR 0%, Ki-67 90%, HER-2 negative, Oncotype DX score 15, low risk status post radiation, on Arimidex since July  2012  Arimidex toxicities: 1. Occasional myalgias 2. Bone density done in March 2015 showed a T score of -1.7 osteopenia: I recommended that she drink milk or take calcium supplementation. She would like to drink 2 glasses of milk every day. I encourage her to spend at least 30 minutes outdoors in the sun to get vitamin D. Next bone density be done in March 2017.  Breast cancer surveillance: 1. Breast exam 10/18/2014 is normal 2. Mammogram to be done April 2016   Chronic normocytic anemia: Hemoglobin stable at 10.9. I do not believe this is nutritional deficiency.  Return to clinic in 6 months for follow-up      Orders Placed This Encounter  Procedures  . CBC with Differential    Standing Status: Future     Number of Occurrences:      Standing Expiration Date: 10/18/2015  . Comprehensive metabolic panel (Cmet) - [facility]    Standing Status: Future     Number of Occurrences:      Standing Expiration Date: 10/18/2015   The patient has a good understanding of the overall  plan. she agrees with it. She will call with any problems that may develop before her next visit here.   Rulon Eisenmenger, MD

## 2014-10-18 NOTE — Telephone Encounter (Signed)
Called and lm with new appt for 22mo   anen

## 2014-10-18 NOTE — Assessment & Plan Note (Signed)
Left breast cancer diagnosed 10/24/2010 status post lumpectomy 11/28/2010, 2.2 cm invasive lobular cancer, grade 1, no lymphovascular invasion, T2 N0 M0 stage II A, ER 96%, PR 0%, Ki-67 90%, HER-2 negative, Oncotype DX score 15, low risk status post radiation, on Arimidex since July 2012  Arimidex toxicities: 1. Occasional myalgias 2. Bone density done in March 2015 showed a T score of -1.7 osteopenia: I recommended that she drink milk or take calcium supplementation. She would like to drink 2 glasses of milk every day. I encourage her to spend at least 30 minutes outdoors in the sun to get vitamin D. Next bone density be done in March 2017.  Breast cancer surveillance: 1. Breast exam 10/18/2014 is normal 2. Mammogram to be done April 2016   Return to clinic in 6 months for follow-up

## 2014-11-17 ENCOUNTER — Ambulatory Visit
Admission: RE | Admit: 2014-11-17 | Discharge: 2014-11-17 | Disposition: A | Discharge status: Home / Self Care | Payer: Commercial Managed Care - HMO | Source: Ambulatory Visit | Attending: General Surgery | Admitting: General Surgery

## 2014-11-17 DIAGNOSIS — Z853 Personal history of malignant neoplasm of breast: Secondary | ICD-10-CM | POA: Diagnosis not present

## 2014-12-21 DIAGNOSIS — C50412 Malignant neoplasm of upper-outer quadrant of left female breast: Secondary | ICD-10-CM | POA: Diagnosis not present

## 2014-12-23 DIAGNOSIS — H1851 Endothelial corneal dystrophy: Secondary | ICD-10-CM | POA: Diagnosis not present

## 2014-12-23 DIAGNOSIS — Z961 Presence of intraocular lens: Secondary | ICD-10-CM | POA: Diagnosis not present

## 2014-12-23 DIAGNOSIS — E119 Type 2 diabetes mellitus without complications: Secondary | ICD-10-CM | POA: Diagnosis not present

## 2015-01-11 DIAGNOSIS — M25511 Pain in right shoulder: Secondary | ICD-10-CM | POA: Diagnosis not present

## 2015-01-25 DIAGNOSIS — M859 Disorder of bone density and structure, unspecified: Secondary | ICD-10-CM | POA: Diagnosis not present

## 2015-01-25 DIAGNOSIS — I10 Essential (primary) hypertension: Secondary | ICD-10-CM | POA: Diagnosis not present

## 2015-01-25 DIAGNOSIS — I350 Nonrheumatic aortic (valve) stenosis: Secondary | ICD-10-CM | POA: Diagnosis not present

## 2015-01-25 DIAGNOSIS — E782 Mixed hyperlipidemia: Secondary | ICD-10-CM | POA: Diagnosis not present

## 2015-01-25 DIAGNOSIS — E1122 Type 2 diabetes mellitus with diabetic chronic kidney disease: Secondary | ICD-10-CM | POA: Diagnosis not present

## 2015-01-25 DIAGNOSIS — N183 Chronic kidney disease, stage 3 (moderate): Secondary | ICD-10-CM | POA: Diagnosis not present

## 2015-04-19 ENCOUNTER — Telehealth: Payer: Self-pay | Admitting: Hematology and Oncology

## 2015-04-19 ENCOUNTER — Ambulatory Visit (HOSPITAL_BASED_OUTPATIENT_CLINIC_OR_DEPARTMENT_OTHER): Payer: Commercial Managed Care - HMO | Admitting: Hematology and Oncology

## 2015-04-19 ENCOUNTER — Encounter: Payer: Self-pay | Admitting: Hematology and Oncology

## 2015-04-19 ENCOUNTER — Other Ambulatory Visit (HOSPITAL_BASED_OUTPATIENT_CLINIC_OR_DEPARTMENT_OTHER): Payer: Commercial Managed Care - HMO

## 2015-04-19 VITALS — BP 170/69 | HR 65 | Temp 97.9°F | Resp 18 | Ht 59.0 in | Wt 194.1 lb

## 2015-04-19 DIAGNOSIS — C50912 Malignant neoplasm of unspecified site of left female breast: Secondary | ICD-10-CM

## 2015-04-19 LAB — COMPREHENSIVE METABOLIC PANEL (CC13)
ALT: 12 U/L (ref 0–55)
AST: 20 U/L (ref 5–34)
Albumin: 4 g/dL (ref 3.5–5.0)
Alkaline Phosphatase: 49 U/L (ref 40–150)
Anion Gap: 7 mEq/L (ref 3–11)
BILIRUBIN TOTAL: 0.35 mg/dL (ref 0.20–1.20)
BUN: 29.8 mg/dL — ABNORMAL HIGH (ref 7.0–26.0)
CO2: 23 mEq/L (ref 22–29)
CREATININE: 1 mg/dL (ref 0.6–1.1)
Calcium: 9.5 mg/dL (ref 8.4–10.4)
Chloride: 108 mEq/L (ref 98–109)
EGFR: 53 mL/min/{1.73_m2} — AB (ref >90)
GLUCOSE: 121 mg/dL (ref 70–140)
Potassium: 4.6 mEq/L (ref 3.5–5.1)
SODIUM: 138 meq/L (ref 136–145)
TOTAL PROTEIN: 7 g/dL (ref 6.4–8.3)

## 2015-04-19 LAB — CBC WITH DIFFERENTIAL/PLATELET
BASO%: 0.8 % (ref 0.0–2.0)
Basophils Absolute: 0 10*3/uL (ref 0.0–0.1)
EOS%: 2.9 % (ref 0.0–7.0)
Eosinophils Absolute: 0.1 10*3/uL (ref 0.0–0.5)
HCT: 34.9 % (ref 34.8–46.6)
HEMOGLOBIN: 11.3 g/dL — AB (ref 11.6–15.9)
LYMPH%: 30.2 % (ref 14.0–49.7)
MCH: 31 pg (ref 25.1–34.0)
MCHC: 32.4 g/dL (ref 31.5–36.0)
MCV: 95.7 fL (ref 79.5–101.0)
MONO#: 0.5 10*3/uL (ref 0.1–0.9)
MONO%: 9.2 % (ref 0.0–14.0)
NEUT%: 56.9 % (ref 38.4–76.8)
NEUTROS ABS: 2.9 10*3/uL (ref 1.5–6.5)
PLATELETS: 222 10*3/uL (ref 145–400)
RBC: 3.64 10*6/uL — ABNORMAL LOW (ref 3.70–5.45)
RDW: 14.1 % (ref 11.2–14.5)
WBC: 5.2 10*3/uL (ref 3.9–10.3)
lymph#: 1.6 10*3/uL (ref 0.9–3.3)

## 2015-04-19 NOTE — Assessment & Plan Note (Signed)
Left breast cancer diagnosed 10/24/2010 status post lumpectomy 11/28/2010, 2.2 cm invasive lobular cancer, grade 1, no lymphovascular invasion, T2 N0 M0 stage II A, ER 96%, PR 0%, Ki-67 90%, HER-2 negative, Oncotype DX score 15, low risk status post radiation, on Arimidex since July 2012  Arimidex toxicities: 1. Occasional myalgias 2. Bone density done in March 2015 showed a T score of -1.7 osteopenia: I recommended that she drink milk or take calcium supplementation. She would like to drink 2 glasses of milk every day. I encourage her to spend at least 30 minutes outdoors in the sun to get vitamin D. Next bone density be done in March 2017.  Breast cancer surveillance: 1. Breast exam 04/19/2015 is normal 2. Mammogram 11/17/2014: No evidence of malignancy, breast density category B   Chronic normocytic anemia: Hemoglobin stable at 10.9. I do not believe this is nutritional deficiency.  Return to clinic in 1 year for follow-up

## 2015-04-19 NOTE — Progress Notes (Signed)
Patient Care Team: Carol Ada, MD as PCP - General (Family Medicine)  DIAGNOSIS: No matching staging information was found for the patient.  SUMMARY OF ONCOLOGIC HISTORY:   Breast cancer, left breast   11/28/2010 Surgery Left lumpectomy: Invasive lobular carcinoma 2.2 cm, grade 1, no lymphovascular invasion, 0/2 sentinel nodes, ER 96%, PR 0%, Ki-67 90%, HER-2 negative, Oncotype DX score 15   01/05/2011 - 02/27/2011 Radiation Therapy Adjuvant radiation therapy   03/19/2011 -  Anti-estrogen oral therapy Arimidex 1 mg daily    CHIEF COMPLIANT: follow-up of left breast cancer  INTERVAL HISTORY: Renee [name] is a 74 year old with above-mentioned history of invasive lobular carcinoma of the left breast reveals lumpectomy and radiation is currently on Arimidex since August 2012. She has been tolerating it extremely well without any major problems or concerns. She's completed 4 years of therapy. Her major complaints are related to right shoulder pain. She went to see an orthopedic specialist but was only specialist for the back. She does not want to see any other specialist because it requires a $45 copayment.  REVIEW OF SYSTEMS:   Constitutional: Denies fevers, chills or abnormal weight loss Eyes: Denies blurriness of vision Ears, nose, mouth, throat, and face: Denies mucositis or sore throat Respiratory: Denies cough, dyspnea or wheezes Cardiovascular: Denies palpitation, chest discomfort or lower extremity swelling Gastrointestinal:  Denies nausea, heartburn or change in bowel habits Skin: Denies abnormal skin rashes Lymphatics: Denies new lymphadenopathy or easy bruising Neurological:Denies numbness, tingling or new weaknesses Behavioral/Psych: Mood is stable, no new changes  Breast:  denies any pain or lumps or nodules in either breasts All other systems were reviewed with the patient and are negative.  I have reviewed the past medical history, past surgical history, social history and  family history with the patient and they are unchanged from previous note.  ALLERGIES:  is allergic to sulfonamide derivatives and sulfa antibiotics.  MEDICATIONS:  Current Outpatient Prescriptions  Medication Sig Dispense Refill  . alendronate (FOSAMAX) 35 MG tablet Take 70 mg by mouth every 7 (seven) days. Take with a full glass of water on an empty stomach.    Marland Kitchen anastrozole (ARIMIDEX) 1 MG tablet Take 1 tablet (1 mg total) by mouth daily. 90 tablet 3  . atorvastatin (LIPITOR) 10 MG tablet Take 5 mg by mouth daily.     . metFORMIN (GLUCOPHAGE-XR) 500 MG 24 hr tablet     . metoprolol (TOPROL-XL) 100 MG 24 hr tablet Take 100 mg by mouth daily.      . pioglitazone-metformin (ACTOPLUS MET) 15-500 MG per tablet Take 1 tablet by mouth 2 (two) times daily with a meal.      . valsartan-hydrochlorothiazide (DIOVAN-HCT) 160-25 MG per tablet Take 1 tablet by mouth daily.      . calcium-vitamin D 250-100 MG-UNIT per tablet Take 1 tablet by mouth daily. Not sure of dose    . HYDROcodone-acetaminophen (NORCO) 5-325 MG per tablet Take 1 tablet by mouth every 6 (six) hours as needed for moderate pain. (Patient not taking: Reported on 04/19/2015) 60 tablet 0  . Multiple Vitamin (MULTIVITAMIN) tablet Take 1 tablet by mouth daily.    . naproxen (NAPROSYN) 375 MG tablet      No current facility-administered medications for this visit.    PHYSICAL EXAMINATION: ECOG PERFORMANCE STATUS: 1 - Symptomatic but completely ambulatory  Filed Vitals:   04/19/15 1050  BP: 170/69  Pulse: 65  Temp: 97.9 F (36.6 C)  Resp: 18   Filed Weights  04/19/15 1050  Weight: 194 lb 1.6 oz (88.043 kg)    GENERAL:alert, no distress and comfortable SKIN: skin color, texture, turgor are normal, no rashes or significant lesions EYES: normal, Conjunctiva are pink and non-injected, sclera clear OROPHARYNX:no exudate, no erythema and lips, buccal mucosa, and tongue normal  NECK: supple, thyroid normal size, non-tender,  without nodularity LYMPH:  no palpable lymphadenopathy in the cervical, axillary or inguinal LUNGS: clear to auscultation and percussion with normal breathing effort HEART: regular rate & rhythm and no murmurs and no lower extremity edema ABDOMEN:abdomen soft, non-tender and normal bowel sounds Musculoskeletal:no cyanosis of digits and no clubbing  NEURO: alert & oriented x 3 with fluent speech, no focal motor/sensory deficits BREAST: No palpable masses or nodules in either right or left breasts. No palpable axillary supraclavicular or infraclavicular adenopathy no breast tenderness or nipple discharge. (exam performed in the presence of a chaperone)  LABORATORY DATA:  I have reviewed the data as listed   Chemistry      Component Value Date/Time   NA 138 10/18/2014 1036   NA 140 02/22/2014 1150   K 4.2 10/18/2014 1036   K 4.4 02/22/2014 1150   CL 102 02/22/2014 1150   CO2 25 10/18/2014 1036   CO2 24 02/22/2014 1150   BUN 29.7* 10/18/2014 1036   BUN 26* 02/22/2014 1150   CREATININE 1.0 10/18/2014 1036   CREATININE 0.99 02/22/2014 1150      Component Value Date/Time   CALCIUM 9.5 10/18/2014 1036   CALCIUM 9.5 02/22/2014 1150   ALKPHOS 45 10/18/2014 1036   ALKPHOS 48 09/03/2011 1316   AST 18 10/18/2014 1036   AST 22 09/03/2011 1316   ALT 13 10/18/2014 1036   ALT 13 09/03/2011 1316   BILITOT 0.37 10/18/2014 1036   BILITOT 0.3 09/03/2011 1316       Lab Results  Component Value Date   WBC 5.2 04/19/2015   HGB 11.3* 04/19/2015   HCT 34.9 04/19/2015   MCV 95.7 04/19/2015   PLT 222 04/19/2015   NEUTROABS 2.9 04/19/2015   ASSESSMENT & PLAN:  Breast cancer, left breast Left breast cancer diagnosed 10/24/2010 status post lumpectomy 11/28/2010, 2.2 cm invasive lobular cancer, grade 1, no lymphovascular invasion, T2 N0 M0 stage II A, ER 96%, PR 0%, Ki-67 90%, HER-2 negative, Oncotype DX score 15, low risk status post radiation, on Arimidex since July 2012  Arimidex  toxicities: 1. Occasional myalgias 2. Bone density done in March 2015 showed a T score of -1.7 osteopenia: I recommended that she drink milk or take calcium supplementation. She would like to drink 2 glasses of milk every day. I encourage her to spend at least 30 minutes outdoors in the sun to get vitamin D. Next bone density be done in March 2017.  Breast cancer surveillance: 1. Breast exam 04/19/2015 is normal 2. Mammogram 11/17/2014: No evidence of malignancy, breast density category B   Chronic normocytic anemia: Hemoglobin stable at 11.3. I do not believe this is nutritional deficiency.  Return to clinic in 1 year for follow-up No orders of the defined types were placed in this encounter.   The patient has a good understanding of the overall plan. she agrees with it. she will call with any problems that may develop before the next visit here.   Rulon Eisenmenger, MD

## 2015-04-19 NOTE — Telephone Encounter (Signed)
Appointments made and avs printed for patient °

## 2015-04-27 DIAGNOSIS — D225 Melanocytic nevi of trunk: Secondary | ICD-10-CM | POA: Diagnosis not present

## 2015-04-27 DIAGNOSIS — X32XXXD Exposure to sunlight, subsequent encounter: Secondary | ICD-10-CM | POA: Diagnosis not present

## 2015-04-27 DIAGNOSIS — L570 Actinic keratosis: Secondary | ICD-10-CM | POA: Diagnosis not present

## 2015-07-20 ENCOUNTER — Other Ambulatory Visit: Payer: Self-pay | Admitting: *Deleted

## 2015-07-20 DIAGNOSIS — C50919 Malignant neoplasm of unspecified site of unspecified female breast: Secondary | ICD-10-CM

## 2015-07-20 MED ORDER — ANASTROZOLE 1 MG PO TABS: 1.0000 mg | ORAL_TABLET | Freq: Every day | ORAL | Status: DC

## 2015-09-26 DIAGNOSIS — Z7984 Long term (current) use of oral hypoglycemic drugs: Secondary | ICD-10-CM | POA: Diagnosis not present

## 2015-09-26 DIAGNOSIS — M129 Arthropathy, unspecified: Secondary | ICD-10-CM | POA: Diagnosis not present

## 2015-09-26 DIAGNOSIS — I10 Essential (primary) hypertension: Secondary | ICD-10-CM | POA: Diagnosis not present

## 2015-09-26 DIAGNOSIS — E1122 Type 2 diabetes mellitus with diabetic chronic kidney disease: Secondary | ICD-10-CM | POA: Diagnosis not present

## 2015-09-26 DIAGNOSIS — Z0000 Encounter for general adult medical examination without abnormal findings: Secondary | ICD-10-CM | POA: Diagnosis not present

## 2015-09-26 DIAGNOSIS — C50919 Malignant neoplasm of unspecified site of unspecified female breast: Secondary | ICD-10-CM | POA: Diagnosis not present

## 2015-09-26 DIAGNOSIS — Z1389 Encounter for screening for other disorder: Secondary | ICD-10-CM | POA: Diagnosis not present

## 2015-09-26 DIAGNOSIS — M8580 Other specified disorders of bone density and structure, unspecified site: Secondary | ICD-10-CM | POA: Diagnosis not present

## 2015-09-26 DIAGNOSIS — E782 Mixed hyperlipidemia: Secondary | ICD-10-CM | POA: Diagnosis not present

## 2015-11-22 DIAGNOSIS — D649 Anemia, unspecified: Secondary | ICD-10-CM | POA: Diagnosis not present

## 2016-03-19 DIAGNOSIS — M79675 Pain in left toe(s): Secondary | ICD-10-CM | POA: Diagnosis not present

## 2016-03-26 DIAGNOSIS — E119 Type 2 diabetes mellitus without complications: Secondary | ICD-10-CM | POA: Diagnosis not present

## 2016-03-26 DIAGNOSIS — I10 Essential (primary) hypertension: Secondary | ICD-10-CM | POA: Diagnosis not present

## 2016-03-26 DIAGNOSIS — E782 Mixed hyperlipidemia: Secondary | ICD-10-CM | POA: Diagnosis not present

## 2016-03-26 DIAGNOSIS — M8580 Other specified disorders of bone density and structure, unspecified site: Secondary | ICD-10-CM | POA: Diagnosis not present

## 2016-04-06 DIAGNOSIS — D649 Anemia, unspecified: Secondary | ICD-10-CM | POA: Diagnosis not present

## 2016-04-17 ENCOUNTER — Telehealth: Payer: Self-pay | Admitting: Hematology and Oncology

## 2016-04-17 NOTE — Telephone Encounter (Signed)
04/19/2016 appointment canceled per patient request. Patient would like to cancel appointment and reschedule following receiving a referral from her PCP / Dr. Tamala Julian due to insurance. Patient will call to reschedule.

## 2016-04-19 ENCOUNTER — Other Ambulatory Visit: Payer: Commercial Managed Care - HMO

## 2016-04-19 ENCOUNTER — Ambulatory Visit: Payer: Commercial Managed Care - HMO | Admitting: Hematology and Oncology

## 2016-04-19 DIAGNOSIS — M8588 Other specified disorders of bone density and structure, other site: Secondary | ICD-10-CM | POA: Diagnosis not present

## 2016-04-27 DIAGNOSIS — L239 Allergic contact dermatitis, unspecified cause: Secondary | ICD-10-CM | POA: Diagnosis not present

## 2016-08-06 DIAGNOSIS — H1033 Unspecified acute conjunctivitis, bilateral: Secondary | ICD-10-CM | POA: Diagnosis not present

## 2016-08-28 DIAGNOSIS — J329 Chronic sinusitis, unspecified: Secondary | ICD-10-CM | POA: Diagnosis not present

## 2016-08-28 DIAGNOSIS — H1010 Acute atopic conjunctivitis, unspecified eye: Secondary | ICD-10-CM | POA: Diagnosis not present

## 2016-09-11 ENCOUNTER — Telehealth: Payer: Self-pay

## 2016-09-11 ENCOUNTER — Other Ambulatory Visit: Payer: Self-pay | Admitting: Hematology and Oncology

## 2016-09-11 DIAGNOSIS — C50919 Malignant neoplasm of unspecified site of unspecified female breast: Secondary | ICD-10-CM

## 2016-09-11 NOTE — Telephone Encounter (Signed)
Pt called asking if she should continue her anastazole b/c it is expired. She does have pills left in the bottle. The expiration date is 02/25/17, pt was confused on date. She states she is having trouble getting together her $45 copay. She will contact her insurance co [employer] to see if she needs a referral for the new year, even though she is an established pt. She feels she can get together a copay in March. Instructed her to make an appt today for March.

## 2016-09-18 NOTE — Telephone Encounter (Signed)
Pt has plan to get PCP to refer her to [facility]. She is an established pt with Dr Lindi Adie but insurance requires she get a referral every time she comes. This is her understanding. Pt is concerned about having the money to make her copay when she gets her appt with Dr Lindi Adie in May. She is asking if she can see Dr Lindi Adie and pay copay later.  forwarded to financial advocate

## 2016-09-19 NOTE — Progress Notes (Signed)
Called pt to schedule her for March 2nd to see Dr.Gudena for follow up. Pt states that she should have enough money to pay for her copay by then. She is seeing her pcp and will make sure to put in a referral for Dr.Gudena. Pt states that [employer] requires a referral.

## 2016-09-26 DIAGNOSIS — Z0000 Encounter for general adult medical examination without abnormal findings: Secondary | ICD-10-CM | POA: Diagnosis not present

## 2016-09-26 DIAGNOSIS — M129 Arthropathy, unspecified: Secondary | ICD-10-CM | POA: Diagnosis not present

## 2016-09-26 DIAGNOSIS — E1122 Type 2 diabetes mellitus with diabetic chronic kidney disease: Secondary | ICD-10-CM | POA: Diagnosis not present

## 2016-09-26 DIAGNOSIS — Z1389 Encounter for screening for other disorder: Secondary | ICD-10-CM | POA: Diagnosis not present

## 2016-09-26 DIAGNOSIS — Z1211 Encounter for screening for malignant neoplasm of colon: Secondary | ICD-10-CM | POA: Diagnosis not present

## 2016-09-26 DIAGNOSIS — I350 Nonrheumatic aortic (valve) stenosis: Secondary | ICD-10-CM | POA: Diagnosis not present

## 2016-09-26 DIAGNOSIS — I10 Essential (primary) hypertension: Secondary | ICD-10-CM | POA: Diagnosis not present

## 2016-09-26 DIAGNOSIS — N183 Chronic kidney disease, stage 3 (moderate): Secondary | ICD-10-CM | POA: Diagnosis not present

## 2016-09-26 DIAGNOSIS — E782 Mixed hyperlipidemia: Secondary | ICD-10-CM | POA: Diagnosis not present

## 2016-10-03 ENCOUNTER — Other Ambulatory Visit: Payer: Self-pay | Admitting: General Surgery

## 2016-10-03 DIAGNOSIS — Z853 Personal history of malignant neoplasm of breast: Secondary | ICD-10-CM

## 2016-10-05 ENCOUNTER — Ambulatory Visit
Admission: RE | Admit: 2016-10-05 | Discharge: 2016-10-05 | Disposition: A | Discharge status: Home / Self Care | Payer: Medicare HMO | Source: Ambulatory Visit | Attending: General Surgery | Admitting: General Surgery

## 2016-10-05 ENCOUNTER — Ambulatory Visit (HOSPITAL_BASED_OUTPATIENT_CLINIC_OR_DEPARTMENT_OTHER): Payer: Medicare HMO | Admitting: Adult Health

## 2016-10-05 ENCOUNTER — Encounter: Payer: Self-pay | Admitting: Adult Health

## 2016-10-05 VITALS — BP 158/76 | HR 74 | Temp 97.9°F | Resp 18 | Wt 192.7 lb

## 2016-10-05 DIAGNOSIS — Z853 Personal history of malignant neoplasm of breast: Secondary | ICD-10-CM | POA: Diagnosis not present

## 2016-10-05 DIAGNOSIS — R928 Other abnormal and inconclusive findings on diagnostic imaging of breast: Secondary | ICD-10-CM | POA: Diagnosis not present

## 2016-10-05 DIAGNOSIS — C50212 Malignant neoplasm of upper-inner quadrant of left female breast: Secondary | ICD-10-CM

## 2016-10-05 DIAGNOSIS — I10 Essential (primary) hypertension: Secondary | ICD-10-CM | POA: Diagnosis not present

## 2016-10-05 DIAGNOSIS — Z170 Estrogen receptor positive status [ER+]: Secondary | ICD-10-CM | POA: Diagnosis not present

## 2016-10-05 NOTE — Progress Notes (Signed)
CLINIC:  Survivorship   REASON FOR VISIT:  Routine follow-up for history of breast cancer.   BRIEF ONCOLOGIC HISTORY:    Malignant neoplasm of upper-inner quadrant of left breast in female, estrogen receptor positive (Renee [name])   11/28/2010 Surgery    Left lumpectomy: Invasive lobular carcinoma 2.2 cm, grade 1, no lymphovascular invasion, 0/2 sentinel nodes, ER 96%, PR 0%, Ki-67 90%, HER-2 negative, Oncotype DX score 15      01/05/2011 - 02/27/2011 Radiation Therapy    Adjuvant radiation therapy      03/19/2011 -  Anti-estrogen oral therapy    Arimidex 1 mg daily        INTERVAL HISTORY:  Renee [name] presents to the [facility] today for routine follow-up for her history of breast cancer.  Overall, she reports feeling quite well. She is taking Arimidex daily and is tolerating it well.  She denies vaginal dryness, hot flashes, or pain.  She does have some arthritis, but it is not worse with taking the arimidex.  Her last dexa was recently and her pcp follows her bone density.  She is finishing up a prescription for fosamax.  She does not have any new pain, weight loss, or further concerns.      REVIEW OF SYSTEMS:  Breast: Denies any new nodularity, masses, tenderness, nipple changes, or nipple discharge.  A 14-point review of systems was completed and was negative, except as noted above.    PAST MEDICAL/SURGICAL HISTORY:  Past Medical History:  Diagnosis Date  . Breast CA    lt breast ca dx 10/2010  . Breast swelling   . Cellulitis   . Diabetes mellitus   . DM type 2 (diabetes mellitus, type 2) 10/01/2012  . HTN (hypertension), benign 10/01/2012  . Hypertension   . Staph aureus infection   . Wears glasses    driving   Past Surgical History:  Procedure Laterality Date  . BACK SURGERY  2010   lumb fusion  . BREAST SURGERY  11/28/10   Left lumpectomy+sln, Lobular,T2N0,ER+,Her2-,Ki9%  . CATARACT EXTRACTION, BILATERAL Bilateral    over 15 years ago  .  CHOLECYSTECTOMY    . COLONOSCOPY    . KNEE ARTHROSCOPY Right 02/24/2014   Procedure: ARTHROSCOPY RIGHT KNEE partial medial and lateral menisectomy chondroplasty;  Surgeon: Kerin Salen, MD;  Location: [facility];  Service: Orthopedics;  Laterality: Right;  . MOLE REMOVAL    . TONSILLECTOMY    . TUBAL LIGATION       ALLERGIES:  Allergies  Allergen Reactions  . Sulfonamide Derivatives   . Sulfa Antibiotics Rash     CURRENT MEDICATIONS:  Outpatient Encounter Prescriptions as of 10/05/2016  Medication Sig Note  . alendronate (FOSAMAX) 35 MG tablet Take 70 mg by mouth every 7 (seven) days. Take with a full glass of water on an empty stomach.   Marland Kitchen atorvastatin (LIPITOR) 10 MG tablet Take 5 mg by mouth daily.    . calcium-vitamin D 250-100 MG-UNIT per tablet Take 1 tablet by mouth daily. Not sure of dose   . HYDROcodone-acetaminophen (NORCO) 5-325 MG per tablet Take 1 tablet by mouth every 6 (six) hours as needed for moderate pain.   . metFORMIN (GLUCOPHAGE-XR) 500 MG 24 hr tablet  04/19/2015: Received from: External Pharmacy  . metoprolol (TOPROL-XL) 100 MG 24 hr tablet Take 100 mg by mouth daily.     . Multiple Vitamin (MULTIVITAMIN) tablet Take 1 tablet by mouth daily.   . naproxen (NAPROSYN) 375 MG  tablet    . pioglitazone-metformin (ACTOPLUS MET) 15-500 MG per tablet Take 1 tablet by mouth 2 (two) times daily with a meal.     . valsartan-hydrochlorothiazide (DIOVAN-HCT) 160-25 MG per tablet Take 1 tablet by mouth daily.     . [DISCONTINUED] anastrozole (ARIMIDEX) 1 MG tablet Take 1 tablet (1 mg total) by mouth daily.    No facility-administered encounter medications on file as of 10/05/2016.      ONCOLOGIC FAMILY HISTORY:  Family History  Problem Relation Age of Onset  . Heart disease Mother   . Heart disease Brother       SOCIAL HISTORY:  Renee [name] is single and lives alone in [place], [place].   She denies any current or history of tobacco,  alcohol, or illicit drug use.     PHYSICAL EXAMINATION:  Vital Signs: Vitals:   10/05/16 1007  BP: (!) 158/76  Pulse: 74  Resp: 18  Temp: 97.9 F (36.6 C)   Filed Weights   10/05/16 1007  Weight: 192 lb 11.2 oz (87.4 kg)   General: Well-nourished, well-appearing female in no acute distress.  Unaccompanied today.   HEENT: Head is normocephalic.  Pupils equal and reactive to light. Conjunctivae clear without exudate.  Sclerae anicteric. Oral mucosa is pink, moist.  Oropharynx is pink without lesions or erythema.  Lymph: No cervical, supraclavicular, or infraclavicular lymphadenopathy noted on palpation.  Cardiovascular: Regular rate and rhythm.Marland Kitchen Respiratory: Clear to auscultation bilaterally. Chest expansion symmetric; breathing non-labored.  Breast Exam:  -Left breast: No appreciable masses on palpation. No skin redness, thickening, or peau d'orange appearance; no nipple retraction or nipple discharge; mild distortion in symmetry at previous lumpectomy site scar with thickened tissue present without erythema or nodularity.  -Right breast: No appreciable masses on palpation. No skin redness, thickening, or peau d'orange appearance; no nipple retraction or nipple discharge;  -Axilla: No axillary adenopathy bilaterally.  GI: Abdomen soft and round; non-tender, non-distended. Bowel sounds normoactive. No hepatosplenomegaly.   GU: Deferred.  Neuro: No focal deficits. Steady gait.  Psych: Mood and affect normal and appropriate for situation.  Extremities: No edema. Skin: Warm and dry.  LABORATORY DATA:  None for this visit   DIAGNOSTIC IMAGING:  Most recent mammogram:     ASSESSMENT AND PLAN:  Ms.. [name] is a pleasant 76 y.o. female with history of Stage IIA left breast invasive lobular carcinoma, ER+/PR-/HER2-, diagnosed in 2012, treated with lumpectomy, adjuvant radiation therapy, and anti-estrogen therapy with Anastrozole beginning in 03/2011.  She presents to the Survivorship  Clinic for surveillance and routine follow-up.   1. History of breast cancer:  Renee [name] is currently clinically and radiographically without evidence of disease or recurrence of breast cancer. She will be due for mammogram today which is already ordered.  She will continue her anti-estrogen therapy with Anastrozole, with plans to continue for 7 years.   I encouraged her to call me with any questions or concerns before her next visit at the cancer center, and I would be happy to see her sooner, if needed.    2. Bone health:  Given Renee [name]'s age, history of breast cancer, and her current anti-estrogen therapy with Anastrozole, she is at risk for bone demineralization. She does have osteopenia and her PCP is managing this with fosamax and following her bone densities.  She was encouraged to continue with consumption of foods rich in calcium, as well as continue with weight-bearing activities.  She was given education on specific food and  activities to promote bone health.  3. Cancer screening:  Due to Renee [name] history and her age, she should receive screening for skin cancers, colon cancer, and gynecologic cancers. She was encouraged to follow-up with her PCP for appropriate cancer screenings.   4. Health maintenance and wellness promotion: Renee [name] was encouraged to consume 5-7 servings of fruits and vegetables per day. She was also encouraged to engage in moderate to vigorous exercise for 30 minutes per day most days of the week. She was instructed to limit her alcohol consumption and continue to abstain from tobacco use.    Dispo:  -Return to cancer center in one year for LTS follow up -mammogram today -f/u with Dr. Marlou Starks in 6 months   A total of (30) minutes of face-to-face time was spent with this patient with greater than 50% of that time in counseling and care-coordination.   Charlestine Massed, NP Survivorship Program [facility] [phone]   Note:  PRIMARY CARE PROVIDER Reginia Naas, [place] [phone]

## 2016-10-19 DIAGNOSIS — Z1211 Encounter for screening for malignant neoplasm of colon: Secondary | ICD-10-CM | POA: Diagnosis not present

## 2016-11-20 DIAGNOSIS — H539 Unspecified visual disturbance: Secondary | ICD-10-CM | POA: Diagnosis not present

## 2016-11-20 DIAGNOSIS — I10 Essential (primary) hypertension: Secondary | ICD-10-CM | POA: Diagnosis not present

## 2016-11-29 DIAGNOSIS — E119 Type 2 diabetes mellitus without complications: Secondary | ICD-10-CM | POA: Diagnosis not present

## 2016-11-29 DIAGNOSIS — H524 Presbyopia: Secondary | ICD-10-CM | POA: Diagnosis not present

## 2017-03-26 DIAGNOSIS — E782 Mixed hyperlipidemia: Secondary | ICD-10-CM | POA: Diagnosis not present

## 2017-03-26 DIAGNOSIS — C50912 Malignant neoplasm of unspecified site of left female breast: Secondary | ICD-10-CM | POA: Diagnosis not present

## 2017-03-26 DIAGNOSIS — I350 Nonrheumatic aortic (valve) stenosis: Secondary | ICD-10-CM | POA: Diagnosis not present

## 2017-03-26 DIAGNOSIS — E1122 Type 2 diabetes mellitus with diabetic chronic kidney disease: Secondary | ICD-10-CM | POA: Diagnosis not present

## 2017-03-26 DIAGNOSIS — N183 Chronic kidney disease, stage 3 (moderate): Secondary | ICD-10-CM | POA: Diagnosis not present

## 2017-03-26 DIAGNOSIS — D649 Anemia, unspecified: Secondary | ICD-10-CM | POA: Diagnosis not present

## 2017-03-26 DIAGNOSIS — Z7984 Long term (current) use of oral hypoglycemic drugs: Secondary | ICD-10-CM | POA: Diagnosis not present

## 2017-03-26 DIAGNOSIS — E119 Type 2 diabetes mellitus without complications: Secondary | ICD-10-CM | POA: Diagnosis not present

## 2017-03-26 DIAGNOSIS — I10 Essential (primary) hypertension: Secondary | ICD-10-CM | POA: Diagnosis not present

## 2017-06-24 DIAGNOSIS — L299 Pruritus, unspecified: Secondary | ICD-10-CM | POA: Diagnosis not present

## 2017-06-24 DIAGNOSIS — L853 Xerosis cutis: Secondary | ICD-10-CM | POA: Diagnosis not present

## 2017-07-03 DIAGNOSIS — E875 Hyperkalemia: Secondary | ICD-10-CM | POA: Diagnosis not present

## 2017-10-04 ENCOUNTER — Encounter: Payer: Medicare HMO | Admitting: Adult Health

## 2017-10-04 NOTE — Progress Notes (Deleted)
CLINIC:  Survivorship   REASON FOR VISIT:  Routine follow-up for history of breast cancer.   BRIEF ONCOLOGIC HISTORY:    Malignant neoplasm of upper-inner quadrant of left breast in female, estrogen receptor positive ([place])   11/28/2010 Surgery    Left lumpectomy: Invasive lobular carcinoma 2.2 cm, grade 1, no lymphovascular invasion, 0/2 sentinel nodes, ER 96%, PR 0%, Ki-67 90%, HER-2 negative, Oncotype DX score 15      01/05/2011 - 02/27/2011 Radiation Therapy    Adjuvant radiation therapy      03/19/2011 -  Anti-estrogen oral therapy    Arimidex 1 mg daily        INTERVAL HISTORY:  Renee [name] presents to the [facility] today for routine follow-up for her history of breast cancer.  Overall, she reports feeling quite well. ***    REVIEW OF SYSTEMS:  Review of Systems - Oncology Breast: Denies any new nodularity, masses, tenderness, nipple changes, or nipple discharge.       PAST MEDICAL/SURGICAL HISTORY:  Past Medical History:  Diagnosis Date  . Breast CA ([place])    lt breast ca dx 10/2010  . Breast swelling   . Cellulitis   . Diabetes mellitus   . DM type 2 (diabetes mellitus, type 2) ([place]) 10/01/2012  . HTN (hypertension), benign 10/01/2012  . Hypertension   . Staph aureus infection   . Wears glasses    driving   Past Surgical History:  Procedure Laterality Date  . BACK SURGERY  2010   lumb fusion  . BREAST LUMPECTOMY Left   . BREAST SURGERY  11/28/10   Left lumpectomy+sln, Lobular,T2N0,ER+,Her2-,Ki9%  . CATARACT EXTRACTION, BILATERAL Bilateral    over 15 years ago  . CHOLECYSTECTOMY    . COLONOSCOPY    . KNEE ARTHROSCOPY Right 02/24/2014   Procedure: ARTHROSCOPY RIGHT KNEE partial medial and lateral menisectomy chondroplasty;  Surgeon: Kerin Salen, MD;  Location: [facility];  Service: Orthopedics;  Laterality: Right;  . MOLE REMOVAL    . TONSILLECTOMY    . TUBAL LIGATION       ALLERGIES:  Allergies  Allergen Reactions  .  Sulfonamide Derivatives   . Sulfa Antibiotics Rash     CURRENT MEDICATIONS:  Outpatient Encounter Medications as of 10/04/2017  Medication Sig Note  . alendronate (FOSAMAX) 35 MG tablet Take 70 mg by mouth every 7 (seven) days. Take with a full glass of water on an empty stomach.   Marland Kitchen anastrozole (ARIMIDEX) 1 MG tablet TAKE 1 TABLET EVERY DAY   . atorvastatin (LIPITOR) 10 MG tablet Take 5 mg by mouth daily.    . calcium-vitamin D 250-100 MG-UNIT per tablet Take 1 tablet by mouth daily. Not sure of dose   . HYDROcodone-acetaminophen (NORCO) 5-325 MG per tablet Take 1 tablet by mouth every 6 (six) hours as needed for moderate pain.   . metFORMIN (GLUCOPHAGE-XR) 500 MG 24 hr tablet  04/19/2015: Received from: External Pharmacy  . metoprolol (TOPROL-XL) 100 MG 24 hr tablet Take 100 mg by mouth daily.     . Multiple Vitamin (MULTIVITAMIN) tablet Take 1 tablet by mouth daily.   . naproxen (NAPROSYN) 375 MG tablet    . pioglitazone-metformin (ACTOPLUS MET) 15-500 MG per tablet Take 1 tablet by mouth 2 (two) times daily with a meal.     . valsartan-hydrochlorothiazide (DIOVAN-HCT) 160-25 MG per tablet Take 1 tablet by mouth daily.      No facility-administered encounter medications on file as of 10/04/2017.  ONCOLOGIC FAMILY HISTORY:  Family History  Problem Relation Age of Onset  . Heart disease Mother   . Heart disease Brother     GENETIC COUNSELING/TESTING: ***  SOCIAL HISTORY:  Renee [name] is /single/married/divorced/widowed/separated and lives alone/with her spouse/family/friend in (city), [place].  She has (#) children and they live in (city).  Renee [name] is currently retired/disabled/working part-time/full-time as ***.  She denies any current or history of tobacco, alcohol, or illicit drug use.     PHYSICAL EXAMINATION:  Vital Signs: There were no vitals filed for this visit. There were no vitals filed for this visit. General: Well-nourished, well-appearing female  in no acute distress.  Unaccompanied/Accompanied by***** today.   HEENT: Head is normocephalic.  Pupils equal and reactive to light. Conjunctivae clear without exudate.  Sclerae anicteric. Oral mucosa is pink, moist.  Oropharynx is pink without lesions or erythema.  Lymph: No cervical, supraclavicular, or infraclavicular lymphadenopathy noted on palpation.  Cardiovascular: Regular rate and rhythm.Marland Kitchen Respiratory: Clear to auscultation bilaterally. Chest expansion symmetric; breathing non-labored.  Breast Exam:  -Left breast: No appreciable masses on palpation. No skin redness, thickening, or peau d'orange appearance; no nipple retraction or nipple discharge; mild distortion in symmetry at previous lumpectomy site***healed scar without erythema or nodularity.  -Right breast: No appreciable masses on palpation. No skin redness, thickening, or peau d'orange appearance; no nipple retraction or nipple discharge; mild distortion in symmetry at previous lumpectomy site***healed scar without erythema or nodularity. -Axilla: No axillary adenopathy bilaterally.  GI: Abdomen soft and round; non-tender, non-distended. Bowel sounds normoactive. No hepatosplenomegaly.   GU: Deferred.  Neuro: No focal deficits. Steady gait.  Psych: Mood and affect normal and appropriate for situation.  MSK: No focal spinal tenderness to palpation, full range of motion in bilateral upper extremities Extremities: No edema. Skin: Warm and dry.  LABORATORY DATA:  None for this visit***   DIAGNOSTIC IMAGING:  Most recent mammogram: ***    ASSESSMENT AND PLAN:  Ms.. [name] is a pleasant 77 y.o. female with history of Stage IIA left breast invasive lobular carcinoma, ER+/PR-/HER2-, diagnosed in 11/2010, treated with lumpectomy, adjuvant radiation therapy, and anti-estrogen therapy with Anastrozole beginning in 03/2011.  She presents to the Survivorship Clinic for surveillance and routine follow-up.   1. History of breast cancer:   Renee [name] is currently clinically and radiographically without evidence of disease or recurrence of breast cancer. She will be due for mammogram in ***; orders placed today.  She will continue her anti-estrogen therapy with Anastrozole, with plans to continue for 7 years.  She will return to the cancer center to see her medical oncologist, Dr. ***, in ***/2018.  I encouraged her to call me with any questions or concerns before her next visit at the cancer center, and I would be happy to see her sooner, if needed.    #. Problem(s) at Visit___________________.  #. Bone health:  Given Renee [name]'s age, history of breast cancer, and her current anti-estrogen therapy with Anastrozole, she is at risk for bone demineralization. She has h/o osteopenia and is following closely with her PCP about this.  She is taking Fosamax and her PCP is following her bone density testing.  In the meantime, she was encouraged to increase her consumption of foods rich in calcium, as well as increase her weight-bearing activities.  She was given education on specific food and activities to promote bone health.  #. Cancer screening:  Due to Renee [name]'s history and her age, she should receive  screening for skin cancers, colon cancer, and ***gynecologic cancers. She was encouraged to follow-up with her PCP for appropriate cancer screenings.   #. Health maintenance and wellness promotion: Renee [name] was encouraged to consume 5-7 servings of fruits and vegetables per day. She was also encouraged to engage in moderate to vigorous exercise for 30 minutes per day most days of the week. She was instructed to limit her alcohol consumption and continue to abstain from tobacco use/was encouraged stop smoking.  ***    Dispo:  -Return to cancer center in one year for f/u -F/u with Dr. Marlou Starks in 6 months -mammogram due   A total of (30) minutes of face-to-face time was spent with this patient with greater than 50% of that time in  counseling and care-coordination.   Gardenia Phlegm, NP Survivorship Program [facility] [phone]   Note: PRIMARY CARE PROVIDER Carol Ada, [place] [phone]

## 2017-10-15 DIAGNOSIS — E782 Mixed hyperlipidemia: Secondary | ICD-10-CM | POA: Diagnosis not present

## 2017-10-15 DIAGNOSIS — I10 Essential (primary) hypertension: Secondary | ICD-10-CM | POA: Diagnosis not present

## 2017-10-15 DIAGNOSIS — M8580 Other specified disorders of bone density and structure, unspecified site: Secondary | ICD-10-CM | POA: Diagnosis not present

## 2017-10-15 DIAGNOSIS — E119 Type 2 diabetes mellitus without complications: Secondary | ICD-10-CM | POA: Diagnosis not present

## 2017-10-15 DIAGNOSIS — N183 Chronic kidney disease, stage 3 (moderate): Secondary | ICD-10-CM | POA: Diagnosis not present

## 2017-10-15 DIAGNOSIS — E1122 Type 2 diabetes mellitus with diabetic chronic kidney disease: Secondary | ICD-10-CM | POA: Diagnosis not present

## 2017-10-15 DIAGNOSIS — M129 Arthropathy, unspecified: Secondary | ICD-10-CM | POA: Diagnosis not present

## 2017-10-15 DIAGNOSIS — Z0000 Encounter for general adult medical examination without abnormal findings: Secondary | ICD-10-CM | POA: Diagnosis not present

## 2017-10-15 DIAGNOSIS — Z1389 Encounter for screening for other disorder: Secondary | ICD-10-CM | POA: Diagnosis not present

## 2017-10-21 ENCOUNTER — Inpatient Hospital Stay: Payer: Medicare HMO | Attending: Adult Health | Admitting: Adult Health

## 2017-10-21 VITALS — BP 156/74 | HR 87 | Temp 98.2°F | Resp 17 | Ht 59.0 in | Wt 197.6 lb

## 2017-10-21 DIAGNOSIS — I10 Essential (primary) hypertension: Secondary | ICD-10-CM | POA: Diagnosis not present

## 2017-10-21 DIAGNOSIS — Z79899 Other long term (current) drug therapy: Secondary | ICD-10-CM | POA: Insufficient documentation

## 2017-10-21 DIAGNOSIS — Z7984 Long term (current) use of oral hypoglycemic drugs: Secondary | ICD-10-CM | POA: Insufficient documentation

## 2017-10-21 DIAGNOSIS — E119 Type 2 diabetes mellitus without complications: Secondary | ICD-10-CM | POA: Diagnosis not present

## 2017-10-21 DIAGNOSIS — Z79811 Long term (current) use of aromatase inhibitors: Secondary | ICD-10-CM | POA: Diagnosis not present

## 2017-10-21 DIAGNOSIS — Z170 Estrogen receptor positive status [ER+]: Secondary | ICD-10-CM | POA: Diagnosis not present

## 2017-10-21 DIAGNOSIS — Z923 Personal history of irradiation: Secondary | ICD-10-CM

## 2017-10-21 DIAGNOSIS — Z87891 Personal history of nicotine dependence: Secondary | ICD-10-CM | POA: Diagnosis not present

## 2017-10-21 DIAGNOSIS — C50212 Malignant neoplasm of upper-inner quadrant of left female breast: Secondary | ICD-10-CM | POA: Diagnosis not present

## 2017-10-21 NOTE — Progress Notes (Signed)
CLINIC:  Survivorship   REASON FOR VISIT:  Routine follow-up for history of breast cancer.   BRIEF ONCOLOGIC HISTORY:    Malignant neoplasm of upper-inner quadrant of left breast in female, estrogen receptor positive ([place])   11/28/2010 Surgery    Left lumpectomy: Invasive lobular carcinoma 2.2 cm, grade 1, no lymphovascular invasion, 0/2 sentinel nodes, ER 96%, PR 0%, Ki-67 90%, HER-2 negative, Oncotype DX score 15      01/05/2011 - 02/27/2011 Radiation Therapy    Adjuvant radiation therapy      03/19/2011 -  Anti-estrogen oral therapy    Arimidex 1 mg daily        INTERVAL HISTORY:  Renee [name] presents to the [facility] today for routine follow-up for her history of breast cancer.  Overall, she reports feeling quite well. She continues on Anastrozole with good tolerance.  She plans to take for 7 years, which will complete in 5 months.  She denies any issues today.      REVIEW OF SYSTEMS:  Review of Systems  Constitutional: Negative for appetite change, chills, fatigue and fever.  HENT:   Negative for hearing loss, lump/mass and trouble swallowing.   Eyes: Negative for eye problems and icterus.  Respiratory: Negative for chest tightness, cough and shortness of breath.   Cardiovascular: Negative for chest pain, leg swelling and palpitations.  Gastrointestinal: Negative for abdominal distention, abdominal pain, constipation, diarrhea, nausea and vomiting.  Endocrine: Negative for hot flashes.  Genitourinary: Negative for difficulty urinating.   Musculoskeletal: Negative for arthralgias.  Skin: Negative for itching and rash.  Neurological: Negative for dizziness, extremity weakness, headaches and numbness.  Hematological: Negative for adenopathy. Does not bruise/bleed easily.  Psychiatric/Behavioral: Negative for depression. The patient is not nervous/anxious.   Breast: Denies any new nodularity, masses, tenderness, nipple changes, or nipple discharge.        PAST MEDICAL/SURGICAL HISTORY:  Past Medical History:  Diagnosis Date  . Breast CA ([place])    lt breast ca dx 10/2010  . Breast swelling   . Cellulitis   . Diabetes mellitus   . DM type 2 (diabetes mellitus, type 2) ([place]) 10/01/2012  . HTN (hypertension), benign 10/01/2012  . Hypertension   . Staph aureus infection   . Wears glasses    driving   Past Surgical History:  Procedure Laterality Date  . BACK SURGERY  2010   lumb fusion  . BREAST LUMPECTOMY Left   . BREAST SURGERY  11/28/10   Left lumpectomy+sln, Lobular,T2N0,ER+,Her2-,Ki9%  . CATARACT EXTRACTION, BILATERAL Bilateral    over 15 years ago  . CHOLECYSTECTOMY    . COLONOSCOPY    . KNEE ARTHROSCOPY Right 02/24/2014   Procedure: ARTHROSCOPY RIGHT KNEE partial medial and lateral menisectomy chondroplasty;  Surgeon: Kerin Salen, MD;  Location: [facility];  Service: Orthopedics;  Laterality: Right;  . MOLE REMOVAL    . TONSILLECTOMY    . TUBAL LIGATION       ALLERGIES:  Allergies  Allergen Reactions  . Sulfonamide Derivatives   . Sulfa Antibiotics Rash     CURRENT MEDICATIONS:  Outpatient Encounter Medications as of 10/21/2017  Medication Sig Note  . alendronate (FOSAMAX) 35 MG tablet Take 70 mg by mouth every 7 (seven) days. Take with a full glass of water on an empty stomach.   Marland Kitchen anastrozole (ARIMIDEX) 1 MG tablet TAKE 1 TABLET EVERY DAY   . atorvastatin (LIPITOR) 10 MG tablet Take 5 mg by mouth daily.    Marland Kitchen  calcium-vitamin D 250-100 MG-UNIT per tablet Take 1 tablet by mouth daily. Not sure of dose   . HYDROcodone-acetaminophen (NORCO) 5-325 MG per tablet Take 1 tablet by mouth every 6 (six) hours as needed for moderate pain.   . metFORMIN (GLUCOPHAGE-XR) 500 MG 24 hr tablet  04/19/2015: Received from: External Pharmacy  . metoprolol (TOPROL-XL) 100 MG 24 hr tablet Take 100 mg by mouth daily.     . Multiple Vitamin (MULTIVITAMIN) tablet Take 1 tablet by mouth daily.   . naproxen (NAPROSYN)  375 MG tablet    . pioglitazone-metformin (ACTOPLUS MET) 15-500 MG per tablet Take 1 tablet by mouth 2 (two) times daily with a meal.     . valsartan-hydrochlorothiazide (DIOVAN-HCT) 160-25 MG per tablet Take 1 tablet by mouth daily.      No facility-administered encounter medications on file as of 10/21/2017.      ONCOLOGIC FAMILY HISTORY:  Family History  Problem Relation Age of Onset  . Heart disease Mother   . Heart disease Brother       SOCIAL HISTORY:  Social History   Socioeconomic History  . Marital status: Single    Spouse name: Not on file  . Number of children: Not on file  . Years of education: Not on file  . Highest education level: Not on file  Social Needs  . Financial resource strain: Not on file  . Food insecurity - worry: Not on file  . Food insecurity - inability: Not on file  . Transportation needs - medical: Not on file  . Transportation needs - non-medical: Not on file  Occupational History  . Not on file  Tobacco Use  . Smoking status: Former Smoker    Last attempt to quit: 02/19/1994    Years since quitting: 23.6  Substance and Sexual Activity  . Alcohol use: No  . Drug use: No  . Sexual activity: Not on file  Other Topics Concern  . Not on file  Social History Narrative  . Not on file      PHYSICAL EXAMINATION:  Vital Signs: Vitals:   10/21/17 1456  BP: (!) 156/74  Pulse: 87  Resp: 17  Temp: 98.2 F (36.8 C)  SpO2: 99%   Filed Weights   10/21/17 1456  Weight: 197 lb 9.6 oz (89.6 kg)   General: Well-nourished, well-appearing female in no acute distress.  Unaccompanied today.   HEENT: Head is normocephalic.  Pupils equal and reactive to light. Conjunctivae clear without exudate.  Sclerae anicteric. Oral mucosa is pink, moist.  Oropharynx is pink without lesions or erythema.  Lymph: No cervical, supraclavicular, or infraclavicular lymphadenopathy noted on palpation.  Cardiovascular: Regular rate and rhythm.Marland Kitchen Respiratory: Clear  to auscultation bilaterally. Chest expansion symmetric; breathing non-labored.  Breast Exam:  -Left breast: No appreciable masses on palpation. No skin redness, thickening, or peau d'orange appearance; no nipple retraction or nipple discharge; mild distortion in symmetry at previous lumpectomy site well healed scar without erythema or nodularity.  -Right breast: No appreciable masses on palpation. No skin redness, thickening, or peau d'orange appearance; no nipple retraction or nipple discharge;  -Axilla: No axillary adenopathy bilaterally.  GI: Abdomen soft and round; non-tender, non-distended. Bowel sounds normoactive. No hepatosplenomegaly.   GU: Deferred.  Neuro: No focal deficits. Steady gait.  Psych: Mood and affect normal and appropriate for situation.  MSK: No focal spinal tenderness to palpation, full range of motion in bilateral upper extremities Extremities: No edema. Skin: Warm and dry.  LABORATORY DATA:  None for this visit   DIAGNOSTIC IMAGING:  Most recent mammogram: due   ASSESSMENT AND PLAN:  Ms.. [name] is a pleasant 77 y.o. female with history of Stage IIA left breast invasive lobular carcinoma, ER+/PR-/HER2-, diagnosed in 10/2010, treated with lumpectomy, adjuvant radiation therapy, and anti-estrogen therapy with Anastrozole beginning in 03/2011.  [facility] for surveillance and routine follow-up.   1. History of breast cancer:  Renee [name] is currently clinically and radiographically without evidence of disease or recurrence of breast cancer. She will be due for mammogram now, it has been ordered and I encouraged her to schedule her mammogram today.Marland Kitchen  She will continue her anti-estrogen therapy with Anastrozole, with plans to continue for 7 years. She will stop taking this in August.  She would like to f/u with her PCP next year for surveillance instead of with [place].  I encouraged her to call me with any questions or concerns, as we are happy to  see her on an as needed basis.    2. Bone health:  Given Renee [name]'s age, history of breast cancer, and her current anti-estrogen therapy with Anastrozole, she is at risk for bone demineralization. The last bone density we have on record is from 2015 and is consistent with osteopenia. She was recommended to continue f/u with PCP regarding bone density testing and management.  She was given education on specific food and activities to promote bone health.  3. Cancer screening:  Due to Renee [name]'s history and her age, she should receive screening for skin cancers, colon cancer. She was encouraged to follow-up with her PCP for appropriate cancer screenings.   4. Health maintenance and wellness promotion: Renee [name] was encouraged to consume 5-7 servings of fruits and vegetables per day. She was also encouraged to engage in moderate to vigorous exercise for 30 minutes per day most days of the week. She was instructed to limit her alcohol consumption and continue to abstain from tobacco use.    Dispo:  -Return to cancer center PRN   A total of (30) minutes of face-to-face time was spent with this patient with greater than 50% of that time in counseling and care-coordination.   Gardenia Phlegm, NP Survivorship Program [facility] [phone]   Note: PRIMARY CARE PROVIDER Carol Ada, [place] [phone]

## 2017-10-23 ENCOUNTER — Encounter: Payer: Self-pay | Admitting: Adult Health

## 2017-11-20 ENCOUNTER — Other Ambulatory Visit: Payer: Self-pay | Admitting: Family Medicine

## 2017-11-20 ENCOUNTER — Other Ambulatory Visit: Payer: Self-pay | Admitting: General Surgery

## 2017-11-20 DIAGNOSIS — Z139 Encounter for screening, unspecified: Secondary | ICD-10-CM

## 2017-11-21 ENCOUNTER — Ambulatory Visit
Admission: RE | Admit: 2017-11-21 | Discharge: 2017-11-21 | Disposition: A | Discharge status: Home / Self Care | Payer: Medicare HMO | Source: Ambulatory Visit | Attending: Family Medicine | Admitting: Family Medicine

## 2017-11-21 DIAGNOSIS — Z139 Encounter for screening, unspecified: Secondary | ICD-10-CM

## 2017-11-21 DIAGNOSIS — Z1231 Encounter for screening mammogram for malignant neoplasm of breast: Secondary | ICD-10-CM | POA: Diagnosis not present

## 2018-01-27 DIAGNOSIS — B379 Candidiasis, unspecified: Secondary | ICD-10-CM | POA: Diagnosis not present

## 2018-01-27 DIAGNOSIS — S80261A Insect bite (nonvenomous), right knee, initial encounter: Secondary | ICD-10-CM | POA: Diagnosis not present

## 2018-01-27 DIAGNOSIS — W57XXXA Bitten or stung by nonvenomous insect and other nonvenomous arthropods, initial encounter: Secondary | ICD-10-CM | POA: Diagnosis not present

## 2018-01-27 DIAGNOSIS — Z6841 Body Mass Index (BMI) 40.0 and over, adult: Secondary | ICD-10-CM | POA: Diagnosis not present

## 2018-04-17 DIAGNOSIS — M129 Arthropathy, unspecified: Secondary | ICD-10-CM | POA: Diagnosis not present

## 2018-04-17 DIAGNOSIS — Z7984 Long term (current) use of oral hypoglycemic drugs: Secondary | ICD-10-CM | POA: Diagnosis not present

## 2018-04-17 DIAGNOSIS — E1122 Type 2 diabetes mellitus with diabetic chronic kidney disease: Secondary | ICD-10-CM | POA: Diagnosis not present

## 2018-04-17 DIAGNOSIS — C50912 Malignant neoplasm of unspecified site of left female breast: Secondary | ICD-10-CM | POA: Diagnosis not present

## 2018-04-17 DIAGNOSIS — I10 Essential (primary) hypertension: Secondary | ICD-10-CM | POA: Diagnosis not present

## 2018-04-17 DIAGNOSIS — N183 Chronic kidney disease, stage 3 (moderate): Secondary | ICD-10-CM | POA: Diagnosis not present

## 2018-04-17 DIAGNOSIS — E782 Mixed hyperlipidemia: Secondary | ICD-10-CM | POA: Diagnosis not present

## 2019-01-05 ENCOUNTER — Other Ambulatory Visit: Payer: Self-pay | Admitting: Family Medicine

## 2019-01-05 DIAGNOSIS — Z1231 Encounter for screening mammogram for malignant neoplasm of breast: Secondary | ICD-10-CM

## 2019-02-17 DIAGNOSIS — E1122 Type 2 diabetes mellitus with diabetic chronic kidney disease: Secondary | ICD-10-CM | POA: Diagnosis not present

## 2019-02-17 DIAGNOSIS — M109 Gout, unspecified: Secondary | ICD-10-CM | POA: Diagnosis not present

## 2019-02-17 DIAGNOSIS — N183 Chronic kidney disease, stage 3 (moderate): Secondary | ICD-10-CM | POA: Diagnosis not present

## 2019-02-17 DIAGNOSIS — M129 Arthropathy, unspecified: Secondary | ICD-10-CM | POA: Diagnosis not present

## 2019-02-17 DIAGNOSIS — I10 Essential (primary) hypertension: Secondary | ICD-10-CM | POA: Diagnosis not present

## 2019-02-17 DIAGNOSIS — E782 Mixed hyperlipidemia: Secondary | ICD-10-CM | POA: Diagnosis not present

## 2019-02-17 DIAGNOSIS — Z1389 Encounter for screening for other disorder: Secondary | ICD-10-CM | POA: Diagnosis not present

## 2019-02-17 DIAGNOSIS — Z0000 Encounter for general adult medical examination without abnormal findings: Secondary | ICD-10-CM | POA: Diagnosis not present

## 2019-02-17 DIAGNOSIS — Z853 Personal history of malignant neoplasm of breast: Secondary | ICD-10-CM | POA: Diagnosis not present

## 2019-02-20 ENCOUNTER — Ambulatory Visit: Payer: Medicare HMO

## 2019-04-02 ENCOUNTER — Ambulatory Visit: Payer: Medicare HMO

## 2019-05-06 DIAGNOSIS — C50912 Malignant neoplasm of unspecified site of left female breast: Secondary | ICD-10-CM | POA: Diagnosis not present

## 2019-05-06 DIAGNOSIS — M8580 Other specified disorders of bone density and structure, unspecified site: Secondary | ICD-10-CM | POA: Diagnosis not present

## 2019-05-06 DIAGNOSIS — N183 Chronic kidney disease, stage 3 (moderate): Secondary | ICD-10-CM | POA: Diagnosis not present

## 2019-05-06 DIAGNOSIS — I10 Essential (primary) hypertension: Secondary | ICD-10-CM | POA: Diagnosis not present

## 2019-05-06 DIAGNOSIS — C50919 Malignant neoplasm of unspecified site of unspecified female breast: Secondary | ICD-10-CM | POA: Diagnosis not present

## 2019-05-06 DIAGNOSIS — E782 Mixed hyperlipidemia: Secondary | ICD-10-CM | POA: Diagnosis not present

## 2019-05-06 DIAGNOSIS — Z853 Personal history of malignant neoplasm of breast: Secondary | ICD-10-CM | POA: Diagnosis not present

## 2019-05-06 DIAGNOSIS — E1122 Type 2 diabetes mellitus with diabetic chronic kidney disease: Secondary | ICD-10-CM | POA: Diagnosis not present

## 2019-05-13 DIAGNOSIS — L089 Local infection of the skin and subcutaneous tissue, unspecified: Secondary | ICD-10-CM | POA: Diagnosis not present

## 2019-05-14 ENCOUNTER — Ambulatory Visit: Payer: Medicare HMO

## 2019-06-29 ENCOUNTER — Ambulatory Visit: Payer: Medicare HMO

## 2019-08-20 ENCOUNTER — Ambulatory Visit: Payer: Medicare HMO

## 2019-09-25 ENCOUNTER — Ambulatory Visit: Payer: Medicare HMO

## 2019-09-29 DIAGNOSIS — M8580 Other specified disorders of bone density and structure, unspecified site: Secondary | ICD-10-CM | POA: Diagnosis not present

## 2019-09-29 DIAGNOSIS — E782 Mixed hyperlipidemia: Secondary | ICD-10-CM | POA: Diagnosis not present

## 2019-09-29 DIAGNOSIS — C50912 Malignant neoplasm of unspecified site of left female breast: Secondary | ICD-10-CM | POA: Diagnosis not present

## 2019-09-29 DIAGNOSIS — E1122 Type 2 diabetes mellitus with diabetic chronic kidney disease: Secondary | ICD-10-CM | POA: Diagnosis not present

## 2019-09-29 DIAGNOSIS — Z853 Personal history of malignant neoplasm of breast: Secondary | ICD-10-CM | POA: Diagnosis not present

## 2019-09-29 DIAGNOSIS — N183 Chronic kidney disease, stage 3 unspecified: Secondary | ICD-10-CM | POA: Diagnosis not present

## 2019-09-29 DIAGNOSIS — C50919 Malignant neoplasm of unspecified site of unspecified female breast: Secondary | ICD-10-CM | POA: Diagnosis not present

## 2019-09-29 DIAGNOSIS — I10 Essential (primary) hypertension: Secondary | ICD-10-CM | POA: Diagnosis not present

## 2019-10-26 ENCOUNTER — Ambulatory Visit: Payer: Medicare HMO

## 2019-12-16 ENCOUNTER — Ambulatory Visit: Payer: Medicare HMO

## 2019-12-16 DIAGNOSIS — C50912 Malignant neoplasm of unspecified site of left female breast: Secondary | ICD-10-CM | POA: Diagnosis not present

## 2019-12-16 DIAGNOSIS — C50919 Malignant neoplasm of unspecified site of unspecified female breast: Secondary | ICD-10-CM | POA: Diagnosis not present

## 2019-12-16 DIAGNOSIS — I10 Essential (primary) hypertension: Secondary | ICD-10-CM | POA: Diagnosis not present

## 2019-12-16 DIAGNOSIS — N183 Chronic kidney disease, stage 3 unspecified: Secondary | ICD-10-CM | POA: Diagnosis not present

## 2019-12-16 DIAGNOSIS — M8580 Other specified disorders of bone density and structure, unspecified site: Secondary | ICD-10-CM | POA: Diagnosis not present

## 2019-12-16 DIAGNOSIS — Z853 Personal history of malignant neoplasm of breast: Secondary | ICD-10-CM | POA: Diagnosis not present

## 2019-12-16 DIAGNOSIS — E782 Mixed hyperlipidemia: Secondary | ICD-10-CM | POA: Diagnosis not present

## 2019-12-16 DIAGNOSIS — E1122 Type 2 diabetes mellitus with diabetic chronic kidney disease: Secondary | ICD-10-CM | POA: Diagnosis not present

## 2019-12-16 DIAGNOSIS — G4700 Insomnia, unspecified: Secondary | ICD-10-CM | POA: Diagnosis not present

## 2020-01-28 ENCOUNTER — Ambulatory Visit: Payer: Medicare HMO

## 2020-03-31 DIAGNOSIS — C50912 Malignant neoplasm of unspecified site of left female breast: Secondary | ICD-10-CM | POA: Diagnosis not present

## 2020-03-31 DIAGNOSIS — M8580 Other specified disorders of bone density and structure, unspecified site: Secondary | ICD-10-CM | POA: Diagnosis not present

## 2020-03-31 DIAGNOSIS — G4700 Insomnia, unspecified: Secondary | ICD-10-CM | POA: Diagnosis not present

## 2020-03-31 DIAGNOSIS — I10 Essential (primary) hypertension: Secondary | ICD-10-CM | POA: Diagnosis not present

## 2020-03-31 DIAGNOSIS — E782 Mixed hyperlipidemia: Secondary | ICD-10-CM | POA: Diagnosis not present

## 2020-03-31 DIAGNOSIS — Z853 Personal history of malignant neoplasm of breast: Secondary | ICD-10-CM | POA: Diagnosis not present

## 2020-03-31 DIAGNOSIS — N183 Chronic kidney disease, stage 3 unspecified: Secondary | ICD-10-CM | POA: Diagnosis not present

## 2020-03-31 DIAGNOSIS — C50919 Malignant neoplasm of unspecified site of unspecified female breast: Secondary | ICD-10-CM | POA: Diagnosis not present

## 2020-03-31 DIAGNOSIS — E1122 Type 2 diabetes mellitus with diabetic chronic kidney disease: Secondary | ICD-10-CM | POA: Diagnosis not present

## 2020-04-28 DIAGNOSIS — S161XXA Strain of muscle, fascia and tendon at neck level, initial encounter: Secondary | ICD-10-CM | POA: Diagnosis not present

## 2020-05-05 DIAGNOSIS — C50919 Malignant neoplasm of unspecified site of unspecified female breast: Secondary | ICD-10-CM | POA: Diagnosis not present

## 2020-05-05 DIAGNOSIS — E782 Mixed hyperlipidemia: Secondary | ICD-10-CM | POA: Diagnosis not present

## 2020-05-05 DIAGNOSIS — C50912 Malignant neoplasm of unspecified site of left female breast: Secondary | ICD-10-CM | POA: Diagnosis not present

## 2020-05-05 DIAGNOSIS — I10 Essential (primary) hypertension: Secondary | ICD-10-CM | POA: Diagnosis not present

## 2020-05-05 DIAGNOSIS — G4700 Insomnia, unspecified: Secondary | ICD-10-CM | POA: Diagnosis not present

## 2020-05-05 DIAGNOSIS — E1169 Type 2 diabetes mellitus with other specified complication: Secondary | ICD-10-CM | POA: Diagnosis not present

## 2020-05-05 DIAGNOSIS — Z853 Personal history of malignant neoplasm of breast: Secondary | ICD-10-CM | POA: Diagnosis not present

## 2020-05-05 DIAGNOSIS — M8580 Other specified disorders of bone density and structure, unspecified site: Secondary | ICD-10-CM | POA: Diagnosis not present

## 2020-05-05 DIAGNOSIS — E1122 Type 2 diabetes mellitus with diabetic chronic kidney disease: Secondary | ICD-10-CM | POA: Diagnosis not present

## 2020-05-05 DIAGNOSIS — N183 Chronic kidney disease, stage 3 unspecified: Secondary | ICD-10-CM | POA: Diagnosis not present

## 2020-05-12 DIAGNOSIS — Z7984 Long term (current) use of oral hypoglycemic drugs: Secondary | ICD-10-CM | POA: Diagnosis not present

## 2020-05-12 DIAGNOSIS — E782 Mixed hyperlipidemia: Secondary | ICD-10-CM | POA: Diagnosis not present

## 2020-05-12 DIAGNOSIS — E1122 Type 2 diabetes mellitus with diabetic chronic kidney disease: Secondary | ICD-10-CM | POA: Diagnosis not present

## 2020-05-12 DIAGNOSIS — N183 Chronic kidney disease, stage 3 unspecified: Secondary | ICD-10-CM | POA: Diagnosis not present

## 2020-05-12 DIAGNOSIS — E1169 Type 2 diabetes mellitus with other specified complication: Secondary | ICD-10-CM | POA: Diagnosis not present

## 2020-05-12 DIAGNOSIS — Z0000 Encounter for general adult medical examination without abnormal findings: Secondary | ICD-10-CM | POA: Diagnosis not present

## 2020-05-12 DIAGNOSIS — Z6841 Body Mass Index (BMI) 40.0 and over, adult: Secondary | ICD-10-CM | POA: Diagnosis not present

## 2020-05-12 DIAGNOSIS — Z1389 Encounter for screening for other disorder: Secondary | ICD-10-CM | POA: Diagnosis not present

## 2020-05-12 DIAGNOSIS — I10 Essential (primary) hypertension: Secondary | ICD-10-CM | POA: Diagnosis not present

## 2020-05-12 DIAGNOSIS — Z853 Personal history of malignant neoplasm of breast: Secondary | ICD-10-CM | POA: Diagnosis not present

## 2020-08-01 DIAGNOSIS — B349 Viral infection, unspecified: Secondary | ICD-10-CM | POA: Diagnosis not present

## 2020-08-02 DIAGNOSIS — R519 Headache, unspecified: Secondary | ICD-10-CM | POA: Diagnosis not present

## 2020-08-02 DIAGNOSIS — U071 COVID-19: Secondary | ICD-10-CM | POA: Diagnosis not present

## 2020-10-12 DIAGNOSIS — E782 Mixed hyperlipidemia: Secondary | ICD-10-CM | POA: Diagnosis not present

## 2020-10-12 DIAGNOSIS — I10 Essential (primary) hypertension: Secondary | ICD-10-CM | POA: Diagnosis not present

## 2020-10-12 DIAGNOSIS — N183 Chronic kidney disease, stage 3 unspecified: Secondary | ICD-10-CM | POA: Diagnosis not present

## 2020-10-12 DIAGNOSIS — G4700 Insomnia, unspecified: Secondary | ICD-10-CM | POA: Diagnosis not present

## 2020-10-12 DIAGNOSIS — C50912 Malignant neoplasm of unspecified site of left female breast: Secondary | ICD-10-CM | POA: Diagnosis not present

## 2020-10-12 DIAGNOSIS — E1122 Type 2 diabetes mellitus with diabetic chronic kidney disease: Secondary | ICD-10-CM | POA: Diagnosis not present

## 2020-10-12 DIAGNOSIS — M8580 Other specified disorders of bone density and structure, unspecified site: Secondary | ICD-10-CM | POA: Diagnosis not present

## 2020-10-12 DIAGNOSIS — C50919 Malignant neoplasm of unspecified site of unspecified female breast: Secondary | ICD-10-CM | POA: Diagnosis not present

## 2020-10-12 DIAGNOSIS — Z853 Personal history of malignant neoplasm of breast: Secondary | ICD-10-CM | POA: Diagnosis not present

## 2020-11-07 DIAGNOSIS — E1169 Type 2 diabetes mellitus with other specified complication: Secondary | ICD-10-CM | POA: Diagnosis not present

## 2020-11-07 DIAGNOSIS — S86912A Strain of unspecified muscle(s) and tendon(s) at lower leg level, left leg, initial encounter: Secondary | ICD-10-CM | POA: Diagnosis not present

## 2020-11-07 DIAGNOSIS — E782 Mixed hyperlipidemia: Secondary | ICD-10-CM | POA: Diagnosis not present

## 2020-11-07 DIAGNOSIS — Z6841 Body Mass Index (BMI) 40.0 and over, adult: Secondary | ICD-10-CM | POA: Diagnosis not present

## 2020-11-07 DIAGNOSIS — R946 Abnormal results of thyroid function studies: Secondary | ICD-10-CM | POA: Diagnosis not present

## 2020-11-07 DIAGNOSIS — N183 Chronic kidney disease, stage 3 unspecified: Secondary | ICD-10-CM | POA: Diagnosis not present

## 2020-11-07 DIAGNOSIS — D649 Anemia, unspecified: Secondary | ICD-10-CM | POA: Diagnosis not present

## 2020-11-07 DIAGNOSIS — E1122 Type 2 diabetes mellitus with diabetic chronic kidney disease: Secondary | ICD-10-CM | POA: Diagnosis not present

## 2020-11-07 DIAGNOSIS — I10 Essential (primary) hypertension: Secondary | ICD-10-CM | POA: Diagnosis not present

## 2020-11-07 DIAGNOSIS — I350 Nonrheumatic aortic (valve) stenosis: Secondary | ICD-10-CM | POA: Diagnosis not present

## 2020-11-14 ENCOUNTER — Other Ambulatory Visit: Payer: Self-pay | Admitting: Family Medicine

## 2020-11-14 DIAGNOSIS — Z1231 Encounter for screening mammogram for malignant neoplasm of breast: Secondary | ICD-10-CM

## 2020-11-16 DIAGNOSIS — M25562 Pain in left knee: Secondary | ICD-10-CM | POA: Diagnosis not present

## 2020-11-16 DIAGNOSIS — M25561 Pain in right knee: Secondary | ICD-10-CM | POA: Diagnosis not present

## 2020-12-02 DIAGNOSIS — E1122 Type 2 diabetes mellitus with diabetic chronic kidney disease: Secondary | ICD-10-CM | POA: Diagnosis not present

## 2020-12-02 DIAGNOSIS — E782 Mixed hyperlipidemia: Secondary | ICD-10-CM | POA: Diagnosis not present

## 2020-12-02 DIAGNOSIS — G4700 Insomnia, unspecified: Secondary | ICD-10-CM | POA: Diagnosis not present

## 2020-12-02 DIAGNOSIS — N183 Chronic kidney disease, stage 3 unspecified: Secondary | ICD-10-CM | POA: Diagnosis not present

## 2020-12-02 DIAGNOSIS — I10 Essential (primary) hypertension: Secondary | ICD-10-CM | POA: Diagnosis not present

## 2020-12-02 DIAGNOSIS — E1169 Type 2 diabetes mellitus with other specified complication: Secondary | ICD-10-CM | POA: Diagnosis not present

## 2020-12-02 DIAGNOSIS — M8580 Other specified disorders of bone density and structure, unspecified site: Secondary | ICD-10-CM | POA: Diagnosis not present

## 2021-01-04 ENCOUNTER — Other Ambulatory Visit: Payer: Self-pay

## 2021-01-04 ENCOUNTER — Ambulatory Visit
Admission: RE | Admit: 2021-01-04 | Discharge: 2021-01-04 | Disposition: A | Discharge status: Home / Self Care | Payer: Medicare HMO | Source: Ambulatory Visit | Attending: Family Medicine | Admitting: Family Medicine

## 2021-01-04 DIAGNOSIS — Z1231 Encounter for screening mammogram for malignant neoplasm of breast: Secondary | ICD-10-CM

## 2021-01-10 DIAGNOSIS — E1169 Type 2 diabetes mellitus with other specified complication: Secondary | ICD-10-CM | POA: Diagnosis not present

## 2021-01-10 DIAGNOSIS — C50912 Malignant neoplasm of unspecified site of left female breast: Secondary | ICD-10-CM | POA: Diagnosis not present

## 2021-01-10 DIAGNOSIS — E782 Mixed hyperlipidemia: Secondary | ICD-10-CM | POA: Diagnosis not present

## 2021-01-10 DIAGNOSIS — N183 Chronic kidney disease, stage 3 unspecified: Secondary | ICD-10-CM | POA: Diagnosis not present

## 2021-01-10 DIAGNOSIS — M8580 Other specified disorders of bone density and structure, unspecified site: Secondary | ICD-10-CM | POA: Diagnosis not present

## 2021-01-10 DIAGNOSIS — I10 Essential (primary) hypertension: Secondary | ICD-10-CM | POA: Diagnosis not present

## 2021-01-10 DIAGNOSIS — E1122 Type 2 diabetes mellitus with diabetic chronic kidney disease: Secondary | ICD-10-CM | POA: Diagnosis not present

## 2021-01-10 DIAGNOSIS — Z853 Personal history of malignant neoplasm of breast: Secondary | ICD-10-CM | POA: Diagnosis not present

## 2021-01-10 DIAGNOSIS — G4700 Insomnia, unspecified: Secondary | ICD-10-CM | POA: Diagnosis not present

## 2021-01-10 DIAGNOSIS — C50919 Malignant neoplasm of unspecified site of unspecified female breast: Secondary | ICD-10-CM | POA: Diagnosis not present

## 2021-01-19 DIAGNOSIS — R946 Abnormal results of thyroid function studies: Secondary | ICD-10-CM | POA: Diagnosis not present

## 2021-02-27 DIAGNOSIS — G4700 Insomnia, unspecified: Secondary | ICD-10-CM | POA: Diagnosis not present

## 2021-02-27 DIAGNOSIS — I10 Essential (primary) hypertension: Secondary | ICD-10-CM | POA: Diagnosis not present

## 2021-02-27 DIAGNOSIS — E782 Mixed hyperlipidemia: Secondary | ICD-10-CM | POA: Diagnosis not present

## 2021-02-27 DIAGNOSIS — E1169 Type 2 diabetes mellitus with other specified complication: Secondary | ICD-10-CM | POA: Diagnosis not present

## 2021-02-27 DIAGNOSIS — E1122 Type 2 diabetes mellitus with diabetic chronic kidney disease: Secondary | ICD-10-CM | POA: Diagnosis not present

## 2021-02-27 DIAGNOSIS — M8580 Other specified disorders of bone density and structure, unspecified site: Secondary | ICD-10-CM | POA: Diagnosis not present

## 2021-02-27 DIAGNOSIS — N183 Chronic kidney disease, stage 3 unspecified: Secondary | ICD-10-CM | POA: Diagnosis not present

## 2021-04-21 DIAGNOSIS — H5200 Hypermetropia, unspecified eye: Secondary | ICD-10-CM | POA: Diagnosis not present

## 2021-04-21 DIAGNOSIS — E7800 Pure hypercholesterolemia, unspecified: Secondary | ICD-10-CM | POA: Diagnosis not present

## 2021-04-21 DIAGNOSIS — Z0100 Encounter for examination of eyes and vision without abnormal findings: Secondary | ICD-10-CM | POA: Diagnosis not present

## 2021-04-21 DIAGNOSIS — E119 Type 2 diabetes mellitus without complications: Secondary | ICD-10-CM | POA: Diagnosis not present

## 2021-05-30 DIAGNOSIS — L989 Disorder of the skin and subcutaneous tissue, unspecified: Secondary | ICD-10-CM | POA: Diagnosis not present

## 2021-06-06 DIAGNOSIS — D485 Neoplasm of uncertain behavior of skin: Secondary | ICD-10-CM | POA: Diagnosis not present

## 2021-06-19 DIAGNOSIS — Z7984 Long term (current) use of oral hypoglycemic drugs: Secondary | ICD-10-CM | POA: Diagnosis not present

## 2021-06-19 DIAGNOSIS — R946 Abnormal results of thyroid function studies: Secondary | ICD-10-CM | POA: Diagnosis not present

## 2021-06-19 DIAGNOSIS — E782 Mixed hyperlipidemia: Secondary | ICD-10-CM | POA: Diagnosis not present

## 2021-06-19 DIAGNOSIS — Z1389 Encounter for screening for other disorder: Secondary | ICD-10-CM | POA: Diagnosis not present

## 2021-06-19 DIAGNOSIS — N183 Chronic kidney disease, stage 3 unspecified: Secondary | ICD-10-CM | POA: Diagnosis not present

## 2021-06-19 DIAGNOSIS — I10 Essential (primary) hypertension: Secondary | ICD-10-CM | POA: Diagnosis not present

## 2021-06-19 DIAGNOSIS — M129 Arthropathy, unspecified: Secondary | ICD-10-CM | POA: Diagnosis not present

## 2021-06-19 DIAGNOSIS — Z0000 Encounter for general adult medical examination without abnormal findings: Secondary | ICD-10-CM | POA: Diagnosis not present

## 2021-06-19 DIAGNOSIS — E1122 Type 2 diabetes mellitus with diabetic chronic kidney disease: Secondary | ICD-10-CM | POA: Diagnosis not present

## 2021-07-19 DIAGNOSIS — D485 Neoplasm of uncertain behavior of skin: Secondary | ICD-10-CM | POA: Diagnosis not present

## 2021-08-23 DIAGNOSIS — R946 Abnormal results of thyroid function studies: Secondary | ICD-10-CM | POA: Diagnosis not present

## 2021-10-23 DIAGNOSIS — R946 Abnormal results of thyroid function studies: Secondary | ICD-10-CM | POA: Diagnosis not present

## 2021-11-16 DIAGNOSIS — E1122 Type 2 diabetes mellitus with diabetic chronic kidney disease: Secondary | ICD-10-CM | POA: Diagnosis not present

## 2021-11-16 DIAGNOSIS — E119 Type 2 diabetes mellitus without complications: Secondary | ICD-10-CM | POA: Diagnosis not present

## 2021-11-16 DIAGNOSIS — I10 Essential (primary) hypertension: Secondary | ICD-10-CM | POA: Diagnosis not present

## 2021-11-16 DIAGNOSIS — E782 Mixed hyperlipidemia: Secondary | ICD-10-CM | POA: Diagnosis not present

## 2021-12-18 DIAGNOSIS — E782 Mixed hyperlipidemia: Secondary | ICD-10-CM | POA: Diagnosis not present

## 2021-12-18 DIAGNOSIS — I10 Essential (primary) hypertension: Secondary | ICD-10-CM | POA: Diagnosis not present

## 2021-12-18 DIAGNOSIS — E1169 Type 2 diabetes mellitus with other specified complication: Secondary | ICD-10-CM | POA: Diagnosis not present

## 2021-12-18 DIAGNOSIS — E039 Hypothyroidism, unspecified: Secondary | ICD-10-CM | POA: Diagnosis not present

## 2021-12-18 DIAGNOSIS — Z6841 Body Mass Index (BMI) 40.0 and over, adult: Secondary | ICD-10-CM | POA: Diagnosis not present

## 2021-12-18 DIAGNOSIS — N183 Chronic kidney disease, stage 3 unspecified: Secondary | ICD-10-CM | POA: Diagnosis not present

## 2021-12-18 DIAGNOSIS — Z23 Encounter for immunization: Secondary | ICD-10-CM | POA: Diagnosis not present

## 2021-12-28 DIAGNOSIS — K006 Disturbances in tooth eruption: Secondary | ICD-10-CM | POA: Diagnosis not present

## 2022-02-28 ENCOUNTER — Other Ambulatory Visit: Payer: Self-pay | Admitting: Family Medicine

## 2022-02-28 DIAGNOSIS — Z1231 Encounter for screening mammogram for malignant neoplasm of breast: Secondary | ICD-10-CM

## 2022-03-22 ENCOUNTER — Ambulatory Visit: Payer: Medicare HMO

## 2022-04-12 ENCOUNTER — Ambulatory Visit
Admission: RE | Admit: 2022-04-12 | Discharge: 2022-04-12 | Disposition: A | Discharge status: Home / Self Care | Payer: Medicare HMO | Source: Ambulatory Visit | Attending: Family Medicine | Admitting: Family Medicine

## 2022-04-12 DIAGNOSIS — Z1231 Encounter for screening mammogram for malignant neoplasm of breast: Secondary | ICD-10-CM | POA: Diagnosis not present

## 2022-06-25 DIAGNOSIS — E2839 Other primary ovarian failure: Secondary | ICD-10-CM | POA: Diagnosis not present

## 2022-06-25 DIAGNOSIS — I10 Essential (primary) hypertension: Secondary | ICD-10-CM | POA: Diagnosis not present

## 2022-06-25 DIAGNOSIS — I350 Nonrheumatic aortic (valve) stenosis: Secondary | ICD-10-CM | POA: Diagnosis not present

## 2022-06-25 DIAGNOSIS — E782 Mixed hyperlipidemia: Secondary | ICD-10-CM | POA: Diagnosis not present

## 2022-06-25 DIAGNOSIS — M129 Arthropathy, unspecified: Secondary | ICD-10-CM | POA: Diagnosis not present

## 2022-06-25 DIAGNOSIS — E1169 Type 2 diabetes mellitus with other specified complication: Secondary | ICD-10-CM | POA: Diagnosis not present

## 2022-06-25 DIAGNOSIS — Z1331 Encounter for screening for depression: Secondary | ICD-10-CM | POA: Diagnosis not present

## 2022-06-25 DIAGNOSIS — Z853 Personal history of malignant neoplasm of breast: Secondary | ICD-10-CM | POA: Diagnosis not present

## 2022-06-25 DIAGNOSIS — Z0000 Encounter for general adult medical examination without abnormal findings: Secondary | ICD-10-CM | POA: Diagnosis not present

## 2022-06-26 ENCOUNTER — Other Ambulatory Visit: Payer: Self-pay | Admitting: Family Medicine

## 2022-06-26 DIAGNOSIS — E2839 Other primary ovarian failure: Secondary | ICD-10-CM

## 2022-06-27 ENCOUNTER — Other Ambulatory Visit: Payer: Medicare HMO

## 2022-09-13 DIAGNOSIS — E782 Mixed hyperlipidemia: Secondary | ICD-10-CM | POA: Diagnosis not present

## 2022-12-06 DIAGNOSIS — H1045 Other chronic allergic conjunctivitis: Secondary | ICD-10-CM | POA: Diagnosis not present

## 2022-12-06 DIAGNOSIS — Z961 Presence of intraocular lens: Secondary | ICD-10-CM | POA: Diagnosis not present

## 2022-12-06 DIAGNOSIS — H5203 Hypermetropia, bilateral: Secondary | ICD-10-CM | POA: Diagnosis not present

## 2022-12-06 DIAGNOSIS — E119 Type 2 diabetes mellitus without complications: Secondary | ICD-10-CM | POA: Diagnosis not present

## 2022-12-06 DIAGNOSIS — H52223 Regular astigmatism, bilateral: Secondary | ICD-10-CM | POA: Diagnosis not present

## 2022-12-06 DIAGNOSIS — H524 Presbyopia: Secondary | ICD-10-CM | POA: Diagnosis not present

## 2022-12-13 ENCOUNTER — Ambulatory Visit
Admission: RE | Admit: 2022-12-13 | Discharge: 2022-12-13 | Disposition: A | Discharge status: Home / Self Care | Payer: Medicare HMO | Source: Ambulatory Visit | Attending: Family Medicine | Admitting: Family Medicine

## 2022-12-13 DIAGNOSIS — M810 Age-related osteoporosis without current pathological fracture: Secondary | ICD-10-CM | POA: Diagnosis not present

## 2022-12-13 DIAGNOSIS — N951 Menopausal and female climacteric states: Secondary | ICD-10-CM | POA: Diagnosis not present

## 2022-12-13 DIAGNOSIS — E2839 Other primary ovarian failure: Secondary | ICD-10-CM

## 2022-12-19 DIAGNOSIS — I10 Essential (primary) hypertension: Secondary | ICD-10-CM | POA: Diagnosis not present

## 2022-12-19 DIAGNOSIS — E782 Mixed hyperlipidemia: Secondary | ICD-10-CM | POA: Diagnosis not present

## 2022-12-19 DIAGNOSIS — Z6841 Body Mass Index (BMI) 40.0 and over, adult: Secondary | ICD-10-CM | POA: Diagnosis not present

## 2022-12-19 DIAGNOSIS — M129 Arthropathy, unspecified: Secondary | ICD-10-CM | POA: Diagnosis not present

## 2022-12-19 DIAGNOSIS — E1122 Type 2 diabetes mellitus with diabetic chronic kidney disease: Secondary | ICD-10-CM | POA: Diagnosis not present

## 2022-12-19 DIAGNOSIS — D649 Anemia, unspecified: Secondary | ICD-10-CM | POA: Diagnosis not present

## 2022-12-19 DIAGNOSIS — E039 Hypothyroidism, unspecified: Secondary | ICD-10-CM | POA: Diagnosis not present

## 2023-04-16 DIAGNOSIS — D649 Anemia, unspecified: Secondary | ICD-10-CM | POA: Diagnosis not present

## 2023-04-24 DIAGNOSIS — D649 Anemia, unspecified: Secondary | ICD-10-CM | POA: Diagnosis not present

## 2023-07-08 DIAGNOSIS — E782 Mixed hyperlipidemia: Secondary | ICD-10-CM | POA: Diagnosis not present

## 2023-07-08 DIAGNOSIS — M129 Arthropathy, unspecified: Secondary | ICD-10-CM | POA: Diagnosis not present

## 2023-07-08 DIAGNOSIS — I350 Nonrheumatic aortic (valve) stenosis: Secondary | ICD-10-CM | POA: Diagnosis not present

## 2023-07-08 DIAGNOSIS — M109 Gout, unspecified: Secondary | ICD-10-CM | POA: Diagnosis not present

## 2023-07-08 DIAGNOSIS — Z1331 Encounter for screening for depression: Secondary | ICD-10-CM | POA: Diagnosis not present

## 2023-07-08 DIAGNOSIS — Z0000 Encounter for general adult medical examination without abnormal findings: Secondary | ICD-10-CM | POA: Diagnosis not present

## 2023-07-08 DIAGNOSIS — Z853 Personal history of malignant neoplasm of breast: Secondary | ICD-10-CM | POA: Diagnosis not present

## 2023-07-08 DIAGNOSIS — I10 Essential (primary) hypertension: Secondary | ICD-10-CM | POA: Diagnosis not present

## 2023-07-08 DIAGNOSIS — E1129 Type 2 diabetes mellitus with other diabetic kidney complication: Secondary | ICD-10-CM | POA: Diagnosis not present

## 2024-01-06 DIAGNOSIS — M129 Arthropathy, unspecified: Secondary | ICD-10-CM | POA: Diagnosis not present

## 2024-01-06 DIAGNOSIS — E039 Hypothyroidism, unspecified: Secondary | ICD-10-CM | POA: Diagnosis not present

## 2024-01-06 DIAGNOSIS — Z853 Personal history of malignant neoplasm of breast: Secondary | ICD-10-CM | POA: Diagnosis not present

## 2024-01-06 DIAGNOSIS — E1169 Type 2 diabetes mellitus with other specified complication: Secondary | ICD-10-CM | POA: Diagnosis not present

## 2024-01-06 DIAGNOSIS — E782 Mixed hyperlipidemia: Secondary | ICD-10-CM | POA: Diagnosis not present

## 2024-01-06 DIAGNOSIS — N3281 Overactive bladder: Secondary | ICD-10-CM | POA: Diagnosis not present

## 2024-01-06 DIAGNOSIS — M109 Gout, unspecified: Secondary | ICD-10-CM | POA: Diagnosis not present

## 2024-01-06 DIAGNOSIS — I350 Nonrheumatic aortic (valve) stenosis: Secondary | ICD-10-CM | POA: Diagnosis not present

## 2024-01-06 DIAGNOSIS — I10 Essential (primary) hypertension: Secondary | ICD-10-CM | POA: Diagnosis not present

## 2024-01-08 ENCOUNTER — Other Ambulatory Visit: Payer: Self-pay | Admitting: Family Medicine

## 2024-01-08 DIAGNOSIS — Z1231 Encounter for screening mammogram for malignant neoplasm of breast: Secondary | ICD-10-CM

## 2024-01-28 ENCOUNTER — Ambulatory Visit

## 2024-01-29 DIAGNOSIS — H5203 Hypermetropia, bilateral: Secondary | ICD-10-CM | POA: Diagnosis not present

## 2024-02-06 ENCOUNTER — Ambulatory Visit
Admission: RE | Admit: 2024-02-06 | Discharge: 2024-02-06 | Disposition: A | Discharge status: Home / Self Care | Source: Ambulatory Visit | Attending: Family Medicine | Admitting: Family Medicine

## 2024-02-06 DIAGNOSIS — Z1231 Encounter for screening mammogram for malignant neoplasm of breast: Secondary | ICD-10-CM

## 2024-02-06 HISTORY — DX: Personal history of irradiation: Z92.3

## 2024-02-12 DIAGNOSIS — H524 Presbyopia: Secondary | ICD-10-CM | POA: Diagnosis not present

## 2024-02-12 DIAGNOSIS — H52223 Regular astigmatism, bilateral: Secondary | ICD-10-CM | POA: Diagnosis not present

## 2024-04-29 DIAGNOSIS — X32XXXD Exposure to sunlight, subsequent encounter: Secondary | ICD-10-CM | POA: Diagnosis not present

## 2024-04-29 DIAGNOSIS — L570 Actinic keratosis: Secondary | ICD-10-CM | POA: Diagnosis not present

## 2024-04-29 DIAGNOSIS — C4441 Basal cell carcinoma of skin of scalp and neck: Secondary | ICD-10-CM | POA: Diagnosis not present

## 2024-06-01 DIAGNOSIS — Z833 Family history of diabetes mellitus: Secondary | ICD-10-CM | POA: Diagnosis not present

## 2024-06-01 DIAGNOSIS — M109 Gout, unspecified: Secondary | ICD-10-CM | POA: Diagnosis not present

## 2024-06-01 DIAGNOSIS — N189 Chronic kidney disease, unspecified: Secondary | ICD-10-CM | POA: Diagnosis not present

## 2024-06-01 DIAGNOSIS — Z853 Personal history of malignant neoplasm of breast: Secondary | ICD-10-CM | POA: Diagnosis not present

## 2024-06-01 DIAGNOSIS — Z79890 Hormone replacement therapy (postmenopausal): Secondary | ICD-10-CM | POA: Diagnosis not present

## 2024-06-01 DIAGNOSIS — Z6837 Body mass index (BMI) 37.0-37.9, adult: Secondary | ICD-10-CM | POA: Diagnosis not present

## 2024-06-01 DIAGNOSIS — R32 Unspecified urinary incontinence: Secondary | ICD-10-CM | POA: Diagnosis not present

## 2024-06-01 DIAGNOSIS — I129 Hypertensive chronic kidney disease with stage 1 through stage 4 chronic kidney disease, or unspecified chronic kidney disease: Secondary | ICD-10-CM | POA: Diagnosis not present

## 2024-06-01 DIAGNOSIS — Z8249 Family history of ischemic heart disease and other diseases of the circulatory system: Secondary | ICD-10-CM | POA: Diagnosis not present

## 2024-06-01 DIAGNOSIS — E039 Hypothyroidism, unspecified: Secondary | ICD-10-CM | POA: Diagnosis not present

## 2024-06-01 DIAGNOSIS — E1122 Type 2 diabetes mellitus with diabetic chronic kidney disease: Secondary | ICD-10-CM | POA: Diagnosis not present

## 2024-06-01 DIAGNOSIS — Z9989 Dependence on other enabling machines and devices: Secondary | ICD-10-CM | POA: Diagnosis not present

## 2024-06-01 DIAGNOSIS — M1990 Unspecified osteoarthritis, unspecified site: Secondary | ICD-10-CM | POA: Diagnosis not present

## 2024-06-01 DIAGNOSIS — E785 Hyperlipidemia, unspecified: Secondary | ICD-10-CM | POA: Diagnosis not present

## 2024-06-22 DIAGNOSIS — Z85828 Personal history of other malignant neoplasm of skin: Secondary | ICD-10-CM | POA: Diagnosis not present

## 2024-06-22 DIAGNOSIS — Z08 Encounter for follow-up examination after completed treatment for malignant neoplasm: Secondary | ICD-10-CM | POA: Diagnosis not present
